# Patient Record
Sex: Male | Born: 1980 | Race: White | Hispanic: No | Marital: Married | Smoking: Never smoker
Health system: Southern US, Community
[De-identification: ages and names within clinical notes are randomized; demographics above are authoritative.]

## PROBLEM LIST (undated history)

## (undated) DIAGNOSIS — F419 Anxiety disorder, unspecified: Secondary | ICD-10-CM

## (undated) DIAGNOSIS — D367 Benign neoplasm of other specified sites: Secondary | ICD-10-CM

## (undated) DIAGNOSIS — F32A Depression, unspecified: Secondary | ICD-10-CM

## (undated) DIAGNOSIS — F329 Major depressive disorder, single episode, unspecified: Secondary | ICD-10-CM

## (undated) DIAGNOSIS — E669 Obesity, unspecified: Secondary | ICD-10-CM

## (undated) HISTORY — DX: Benign neoplasm of other specified sites: D36.7

## (undated) HISTORY — DX: Depression, unspecified: F32.A

## (undated) HISTORY — PX: KNEE ARTHROSCOPY: SUR90

## (undated) HISTORY — DX: Major depressive disorder, single episode, unspecified: F32.9

## (undated) HISTORY — DX: Obesity, unspecified: E66.9

## (undated) HISTORY — DX: Anxiety disorder, unspecified: F41.9

## (undated) HISTORY — PX: THORACOTOMY: SUR1349

---

## 2016-07-08 DIAGNOSIS — J0140 Acute pansinusitis, unspecified: Secondary | ICD-10-CM | POA: Diagnosis not present

## 2016-07-08 DIAGNOSIS — H66003 Acute suppurative otitis media without spontaneous rupture of ear drum, bilateral: Secondary | ICD-10-CM | POA: Diagnosis not present

## 2016-11-29 ENCOUNTER — Encounter: Payer: Self-pay | Admitting: Physician Assistant

## 2016-11-29 ENCOUNTER — Ambulatory Visit (INDEPENDENT_AMBULATORY_CARE_PROVIDER_SITE_OTHER): Payer: BLUE CROSS/BLUE SHIELD | Admitting: Physician Assistant

## 2016-11-29 VITALS — BP 117/75 | HR 64 | Temp 98.2°F | Ht 72.0 in | Wt 237.0 lb

## 2016-11-29 DIAGNOSIS — R52 Pain, unspecified: Secondary | ICD-10-CM

## 2016-11-29 DIAGNOSIS — Z7689 Persons encountering health services in other specified circumstances: Secondary | ICD-10-CM | POA: Diagnosis not present

## 2016-11-29 DIAGNOSIS — G5620 Lesion of ulnar nerve, unspecified upper limb: Secondary | ICD-10-CM | POA: Diagnosis not present

## 2016-11-29 MED ORDER — MELOXICAM 15 MG PO TABS: 15.0000 mg | ORAL_TABLET | Freq: Every day | ORAL | 1 refills | Status: DC

## 2016-11-29 NOTE — Progress Notes (Signed)
HPI:                                                                Troy Wilkerson is a 36 y.o. male who presents to [facility]: [place] today to establish care   Current Concerns include myalgias  Patient reports bilateral upper extremity arm pain x 1 week. Has noticed it mostly in his hands and arms. Denies paresthesias or weakness. Denies constitutional symptoms and rash. Denies recent tick bite.   Health Maintenance Health Maintenance  Topic Date Due  . HIV Screening  08/14/1995  . TETANUS/TDAP  08/14/1999  . INFLUENZA VACCINE  11/21/2016    Past Medical History:  Diagnosis Date  . 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 KNEE ARTHROSCOPY     left 2011  . THORACOTOMY     72 mos old   Social History  Substance Use Topics  . Smoking status: Never Smoker  . Smokeless tobacco: Never Used  . Alcohol use No   family history includes Cancer in his mother; Diabetes in his mother; Heart disease in his mother; Hypertension in his brother; Stroke in his brother.  ROS: negative except as noted in the HPI  Medications: Current Outpatient Prescriptions  Medication Sig Dispense Refill  . meloxicam (MOBIC) 15 MG tablet Take 1 tablet (15 mg total) by mouth daily. 30 tablet 1   No current facility-administered medications for this visit.    No Known Allergies     Objective:  BP 117/75 (BP Location: Left Arm, Patient Position: Sitting, Cuff Size: Normal)   Pulse 64   Temp 98.2 F (36.8 C) (Oral)   Ht 6' (1.829 m)   Wt 237 lb (107.5 kg)   SpO2 95%   BMI 32.14 kg/m  Gen:  Obese male, not ill-appearing, no distress HEENT: normocephalic without obvious deformity, normal conjunctiva, trachea midline Pulm: Normal work of breathing, normal phonation, clear to auscultation bilaterally CV: Normal rate, regular rhythm, s1 and s2 distinct, no murmurs, clicks or rubs, no carotid  bruit Neuro: alert and oriented x 3, sensation grossly intact, normal tone, no tremor MSK: upper extremities atraumatic bilaterally, wrist, elbow and shoulder strength 5/5 and symmetric, positive tinel's sign at the cubital fossa bilaterally, normal gait and station, no peripheral edema Skin: warm, dry, intact, no rashes on exposed skin Psych:  cooperative,normal affect, euthymic mood, normal speech and thought content   No results found for this or any previous visit (from the past 72 hour(s)). No results found.  Depression screen PHQ 2/9 11/29/2016  Decreased Interest 2  Down, Depressed, Hopeless 1  PHQ - 2 Score 3  Altered sleeping 3  Tired, decreased energy 2  Change in appetite 2  Feeling bad or failure about yourself  1  Trouble concentrating 1  Moving slowly or fidgety/restless 0  Suicidal thoughts 0  PHQ-9 Score 12     Assessment and Plan: 36 y.o. male with   1. Encounter to establish care - reviewed PMH - negative PHQ2  2. Ulnar neuropathy at elbow, unspecified laterality - Meloxicam 15mg  PO daily - Elbow sleeves nightly at bedtime - Follow-up with Sports Medicine in 4 weeks  3. Myalgia - CBC with Differential/Platelet - CK - Sedimentation rate - Comprehensive metabolic panel - Rheumatoid factor  Patient education and anticipatory guidance given Patient agrees with treatment plan Follow-up in 4 weeks with Sports Medicine or sooner as needed  Darlyne Russian PA-C

## 2016-11-29 NOTE — Patient Instructions (Addendum)
- Meloxicam 1 tab daily. No other OTC pain relievers except Tylenol - Elbow sleeves nightly at bedtime - seam should be in the center of your elbow where it flexes - Follow-up with Sports Medicine in 4 weeks  Cubital Tunnel Syndrome Cubital tunnel syndrome is a condition that causes pain and weakness of the forearm and hand. This condition happens when one of the nerves (ulnar nerve) that runs alongside the elbow joint becomes irritated. What are the causes? Causes of this condition include:  Increased pressure on the ulnar nerve at the elbow, arm, or forearm. This can be caused by: ? Swollen tissues. ? Ligaments. ? Muscles. ? Poorly healed elbow fractures. ? Tumors in the elbow. These are usually noncancerous (benign). ? Scar tissue that develops in the elbow after an injury. ? Bony growths (spurs) near the ulnar nerve.  Stretching of the nerve due to loose elbow ligaments.  Trauma to the nerve at the elbow.  Repetitive elbow bending.  Certain medical conditions.  What increases the risk? This condition is more likely to develop in:  People who do manual labor that requires frequently bending the elbow.  People who play sports that include repeated or strenuous throwing motions, such as baseball.  People who play contact sports, such as football or lacrosse.  People who do not warm up properly before activities.  People who have diabetes.  People who have an underactive thyroid (hypothyroidism).  What are the signs or symptoms? Symptoms of this condition include:  Clumsiness or weakness of the hand.  Tenderness of the inner elbow.  Aching or soreness of the inner elbow, forearm, or fingers, especially the little finger or the ring finger.  Increased pain with forced elbow bending.  Reduced control when throwing.  Tingling, numbness, or burning inside the forearm, or in part of the hand or fingers, especially the little finger or the ring finger.  Sharp pains  that shoot from the elbow down to the wrist and hand.  The inability to grip or pinch hard.  How is this diagnosed? This condition is diagnosed with a medical history and physical exam. Your health care provider will ask about your symptoms and ask for details about any injury. You may also have other tests, including:  Electromyogram (EMG). This test checks how well the nerve is working.  X-ray.  How is this treated? Treatment starts by stopping the activities that are causing your symptoms to get worse. Treatment may include the use of icing and medicines to reduce pain and swelling. You may also be advised to wear a splint to prevent your elbow from bending or wear an elbow pad where the ulnar nerve is closest to the skin. In less severe cases, treatment may also include working with a physical therapist:  To help decrease your symptoms.  To improve the strength and range of motion of your elbow, forearm, and hand.  If the treatments described above do not help, surgery may be needed. Follow these instructions at home: If you have a splint:  Wear it as told by your health care provider. Remove it only as told by your health care provider.  Loosen the splint if your fingers become numb and tingle, or if they turn cold and blue.  Keep the splint clean and dry. Managing pain, stiffness, and swelling  If directed, apply ice to the injured area: ? Put ice in a plastic bag. ? Place a towel between your skin and the bag. ? Leave the ice  on for 20 minutes, 2-3 times per day.  Move your fingers often to avoid stiffness and to lessen swelling.  Raise (elevate) the injured area above the level of your heart while you are sitting or lying down. General instructions  Take over-the-counter and prescription medicines only as told by your health care provider.  Keep all follow-up visits as told by your health care provider. This is important.  Do any exercise or physical therapy as told  by your health care provider.  Do not drive or operate heavy machinery while taking prescription pain medicine.  If you were given an elbow pad, wear it as told by your health care provider. Contact a health care provider if:  Your symptoms get worse.  Your symptoms do not get better with treatment.  Your have new pain.  Your hand on the injured side feels numb or cold. This information is not intended to replace advice given to you by your health care provider. Make sure you discuss any questions you have with your health care provider. Document Released: 04/09/2005 Document Revised: 09/15/2015 Document Reviewed: 06/16/2014 Elsevier Interactive Patient Education  [employer].

## 2016-12-06 ENCOUNTER — Encounter: Payer: Self-pay | Admitting: Physician Assistant

## 2016-12-06 DIAGNOSIS — G5620 Lesion of ulnar nerve, unspecified upper limb: Secondary | ICD-10-CM | POA: Insufficient documentation

## 2016-12-27 ENCOUNTER — Encounter: Payer: Self-pay | Admitting: Family Medicine

## 2016-12-27 ENCOUNTER — Ambulatory Visit (INDEPENDENT_AMBULATORY_CARE_PROVIDER_SITE_OTHER): Payer: BLUE CROSS/BLUE SHIELD | Admitting: Family Medicine

## 2016-12-27 ENCOUNTER — Ambulatory Visit (INDEPENDENT_AMBULATORY_CARE_PROVIDER_SITE_OTHER): Payer: BLUE CROSS/BLUE SHIELD

## 2016-12-27 DIAGNOSIS — M7711 Lateral epicondylitis, right elbow: Secondary | ICD-10-CM | POA: Diagnosis not present

## 2016-12-27 DIAGNOSIS — M25561 Pain in right knee: Secondary | ICD-10-CM | POA: Diagnosis not present

## 2016-12-27 DIAGNOSIS — M7710 Lateral epicondylitis, unspecified elbow: Secondary | ICD-10-CM | POA: Insufficient documentation

## 2016-12-27 DIAGNOSIS — F321 Major depressive disorder, single episode, moderate: Secondary | ICD-10-CM

## 2016-12-27 DIAGNOSIS — G8929 Other chronic pain: Secondary | ICD-10-CM

## 2016-12-27 DIAGNOSIS — M25562 Pain in left knee: Secondary | ICD-10-CM | POA: Insufficient documentation

## 2016-12-27 DIAGNOSIS — M7712 Lateral epicondylitis, left elbow: Secondary | ICD-10-CM | POA: Diagnosis not present

## 2016-12-27 DIAGNOSIS — M2550 Pain in unspecified joint: Secondary | ICD-10-CM | POA: Diagnosis not present

## 2016-12-27 DIAGNOSIS — M7989 Other specified soft tissue disorders: Secondary | ICD-10-CM | POA: Diagnosis not present

## 2016-12-27 LAB — TSH: TSH: 2.16 m[IU]/L (ref 0.40–4.50)

## 2016-12-27 MED ORDER — DICLOFENAC SODIUM 1 % TD GEL: 4.0000 g | Freq: Four times a day (QID) | TRANSDERMAL | 11 refills | Status: DC

## 2016-12-27 MED ORDER — DULOXETINE HCL 30 MG PO CPEP: 30.0000 mg | ORAL_CAPSULE | Freq: Every day | ORAL | 1 refills | Status: DC

## 2016-12-27 NOTE — Patient Instructions (Signed)
Thank you for coming in today. Use the gel on the knees and wrists.  Do the exercises and stretching frequently.  Start Cymbalta for both depression symptoms and pain  You should hear from therapy soon.  Recheck with me in 1 month and make a follow up appointment with Evlyn Clines in 2 weeks.  Get labs today.  Knee xray today as well.    Tennis Elbow Tennis elbow (lateral epicondylitis) is inflammation of the outer tendons of your forearm close to your elbow. Your tendons attach your muscles to your bones. The outer tendons of your forearm are used to extend your wrist, and they attach on the outside part of your elbow. Tennis elbow is often found in people who play tennis, but anyone may get the condition from repeatedly extending the wrist or turning the forearm. What are the causes? This condition is caused by repeatedly extending your wrist and using your hands. It can result from sports or work that requires repetitive forearm movements. Tennis elbow may also be caused by an injury. What increases the risk? You have a higher risk of developing tennis elbow if you play tennis or another racquet sport. You also have a higher risk if you frequently use your hands for work. This condition is also more likely to develop in:  Musicians.  Carpenters, painters, and plumbers.  Cooks.  Cashiers.  People who work in [employer].  Architect workers.  Butchers.  People who use computers.  What are the signs or symptoms? Symptoms of this condition include:  Pain and tenderness in your forearm and the outer part of your elbow. You may only feel the pain when you use your arm, or you may feel it even when you are not using your arm.  A burning feeling that runs from your elbow through your arm.  Weak grip in your hands.  How is this diagnosed? This condition may be diagnosed by medical history and physical exam. You may also have other tests, including:  X-rays.  MRI.  How is this  treated? Your health care provider will recommend lifestyle adjustments, such as resting and icing your arm. Treatment may also include:  Medicines for inflammation. This may include shots of cortisone if your pain continues.  Physical therapy. This may include massage or exercises.  An elbow brace.  Surgery may eventually be recommended if your pain does not go away with treatment. Follow these instructions at home: Activity  Rest your elbow and wrist as directed by your health care provider. Try to avoid any activities that caused the problem until your health care provider says that you can do them again.  If a physical therapist teaches you exercises, do all of them as directed.  If you lift an object, lift it with your palm facing upward. This lowers the stress on your elbow. Lifestyle  If your tennis elbow is caused by sports, check your equipment and make sure that: ? You are using it correctly. ? It is the best fit for you.  If your tennis elbow is caused by work, take breaks frequently, if you are able. Talk with your manager about how to best perform tasks in a way that is safe. ? If your tennis elbow is caused by computer use, talk with your manager about any changes that can be made to your work environment. General instructions  If directed, apply ice to the painful area: ? Put ice in a plastic bag. ? Place a towel between your skin  and the bag. ? Leave the ice on for 20 minutes, 2-3 times per day.  Take medicines only as directed by your health care provider.  If you were given a brace, wear it as directed by your health care provider.  Keep all follow-up visits as directed by your health care provider. This is important. Contact a health care provider if:  Your pain does not get better with treatment.  Your pain gets worse.  You have numbness or weakness in your forearm, hand, or fingers. This information is not intended to replace advice given to you by  your health care provider. Make sure you discuss any questions you have with your health care provider. Document Released: 04/09/2005 Document Revised: 12/08/2015 Document Reviewed: 04/05/2014 Elsevier Interactive Patient Education  2018 [employer].  Duloxetine delayed-release capsules What is this medicine? DULOXETINE (doo LOX e teen) is used to treat depression, anxiety, and different types of chronic pain. This medicine may be used for other purposes; ask your health care provider or pharmacist if you have questions. COMMON BRAND NAME(S): Cymbalta, [place] What should I tell my health care provider before I take this medicine? They need to know if you have any of these conditions: -bipolar disorder or a family history of bipolar disorder -glaucoma -kidney disease -liver disease -suicidal thoughts or a previous suicide attempt -taken medicines called MAOIs like Carbex, Eldepryl, Marplan, Nardil, and Parnate within 14 days -an unusual reaction to duloxetine, other medicines, foods, dyes, or preservatives -pregnant or trying to get pregnant -breast-feeding How should I use this medicine? Take this medicine by mouth with a glass of water. Follow the directions on the prescription label. Do not cut, crush or chew this medicine. You can take this medicine with or without food. Take your medicine at regular intervals. Do not take your medicine more often than directed. Do not stop taking this medicine suddenly except upon the advice of your doctor. Stopping this medicine too quickly may cause serious side effects or your condition may worsen. A special MedGuide will be given to you by the pharmacist with each prescription and refill. Be sure to read this information carefully each time. Talk to your pediatrician regarding the use of this medicine in children. While this drug may be prescribed for children as young as 76 years of age for selected conditions, precautions do apply. Overdosage: If  you think you have taken too much of this medicine contact a poison control center or emergency room at once. NOTE: This medicine is only for you. Do not share this medicine with others. What if I miss a dose? If you miss a dose, take it as soon as you can. If it is almost time for your next dose, take only that dose. Do not take double or extra doses. What may interact with this medicine? Do not take this medicine with any of the following medications: -desvenlafaxine -levomilnacipran -linezolid -MAOIs like Carbex, Eldepryl, Marplan, Nardil, and Parnate -methylene blue (injected into a vein) -milnacipran -thioridazine -venlafaxine This medicine may also interact with the following medications: -alcohol -amphetamines -aspirin and aspirin-like medicines -certain antibiotics like ciprofloxacin and enoxacin -certain medicines for blood pressure, heart disease, irregular heart beat -certain medicines for depression, anxiety, or psychotic disturbances -certain medicines for migraine headache like almotriptan, eletriptan, frovatriptan, naratriptan, rizatriptan, sumatriptan, zolmitriptan -certain medicines that treat or prevent blood clots like warfarin, enoxaparin, and dalteparin -cimetidine -fentanyl -lithium -NSAIDS, medicines for pain and inflammation, like ibuprofen or naproxen -phentermine -procarbazine -rasagiline -sibutramine -[facility]. John's  wort -theophylline -tramadol -tryptophan This list may not describe all possible interactions. Give your health care provider a list of all the medicines, herbs, non-prescription drugs, or dietary supplements you use. Also tell them if you smoke, drink alcohol, or use illegal drugs. Some items may interact with your medicine. What should I watch for while using this medicine? Tell your doctor if your symptoms do not get better or if they get worse. Visit your doctor or health care professional for regular checks on your progress. Because it may  take several weeks to see the full effects of this medicine, it is important to continue your treatment as prescribed by your doctor. Patients and their families should watch out for new or worsening thoughts of suicide or depression. Also watch out for sudden changes in feelings such as feeling anxious, agitated, panicky, irritable, hostile, aggressive, impulsive, severely restless, overly excited and hyperactive, or not being able to sleep. If this happens, especially at the beginning of treatment or after a change in dose, call your health care professional. Dennis Bast may get drowsy or dizzy. Do not drive, use machinery, or do anything that needs mental alertness until you know how this medicine affects you. Do not stand or sit up quickly, especially if you are an older patient. This reduces the risk of dizzy or fainting spells. Alcohol may interfere with the effect of this medicine. Avoid alcoholic drinks. This medicine can cause an increase in blood pressure. This medicine can also cause a sudden drop in your blood pressure, which may make you feel faint and increase the chance of a fall. These effects are most common when you first start the medicine or when the dose is increased, or during use of other medicines that can cause a sudden drop in blood pressure. Check with your doctor for instructions on monitoring your blood pressure while taking this medicine. Your mouth may get dry. Chewing sugarless gum or sucking hard candy, and drinking plenty of water may help. Contact your doctor if the problem does not go away or is severe. What side effects may I notice from receiving this medicine? Side effects that you should report to your doctor or health care professional as soon as possible: -allergic reactions like skin rash, itching or hives, swelling of the face, lips, or tongue -anxious -breathing problems -confusion -changes in vision -chest pain -confusion -elevated mood, decreased need for sleep,  racing thoughts, impulsive behavior -eye pain -fast, irregular heartbeat -feeling faint or lightheaded, falls -feeling agitated, angry, or irritable -hallucination, loss of contact with reality -high blood pressure -loss of balance or coordination -palpitations -redness, blistering, peeling or loosening of the skin, including inside the mouth -restlessness, pacing, inability to keep still -seizures -stiff muscles -suicidal thoughts or other mood changes -trouble passing urine or change in the amount of urine -trouble sleeping -unusual bleeding or bruising -unusually weak or tired -vomiting -yellowing of the eyes or skin Side effects that usually do not require medical attention (report to your doctor or health care professional if they continue or are bothersome): -change in sex drive or performance -change in appetite or weight -constipation -dizziness -dry mouth -headache -increased sweating -nausea -tired This list may not describe all possible side effects. Call your doctor for medical advice about side effects. You may report side effects to FDA at 1-800-FDA-1088. Where should I keep my medicine? Keep out of the reach of children. Store at room temperature between 20 and 25 degrees C (68 to 77 degrees F). Throw  away any unused medicine after the expiration date. NOTE: This sheet is a summary. It may not cover all possible information. If you have questions about this medicine, talk to your doctor, pharmacist, or health care provider.  2018 Elsevier/Gold Standard (2015-09-08 18:16:03)

## 2016-12-27 NOTE — Progress Notes (Signed)
Subjective:    I'm seeing this patient as a consultation for: Nelson Chimes, PA  CC: bilateral wrist, elbow, knee, and ankle pains  HPI: Patient reports bilateral wrist, elbow, knee, and ankle pains for 1 month.   Wrist and elbow pain: Patient rates the severity as about a 4/10. He does not recall any inciting events. They are not worse with any specific movements. He denies any numbness, tingling, or weakness. Patient reports that meloxicam helps alleviate the pains somewhat, but he does not take the medication every day. Patient endorses myalgias.   Knee and ankle pain: Patient rates knee pain as 5/10 in severity. He does not recall any inciting events. 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 Patient reports that pain is random and does not concentrate at any particular time of day. He denies any numbness or tingling. Patient reports a history of meniscal tear in his right knee. He notes the current pain is not consistent with previous episodes of meniscus injury. Patient denies any personal or family history of autoimmune diseases.   Mood: Patient reports difficulty getting out of bed for work in the mornings. He has lost interest in his new job. Patient reports having difficulty with sleep.   Patient denies any fevers, chills, weight loss, diarrhea, constipation, changes in urination, or shortness of breath.   Past medical history, Surgical history, Family history not pertinant except as noted below, Social history, Allergies, and medications have been entered into the medical record, reviewed, and no changes needed.   Review of Systems: No headache, visual changes, nausea, vomiting, diarrhea, constipation, dizziness, abdominal pain, skin rash, fevers, chills, night sweats, weight loss, swollen lymph nodes, body aches, joint swelling, muscle aches, chest pain, shortness of breath, mood changes, visual or auditory hallucinations.     Objective:    Vitals:   12/27/16 0843  BP: 117/78  Pulse: 60   General: Well Developed, well nourished, and in no acute distress.  Neuro/Psych: Alert and oriented x3, extra-ocular muscles intact, able to move all 4 extremities, sensation grossly intact. Depressed mood today.  Skin: Warm and dry, no rashes noted.  Respiratory: Not using accessory muscles, speaking in full sentences, trachea midline.  Cardiovascular: Pulses palpable, no extremity edema. Abdomen: Does not appear distended. MSK: Right and left elbow: No gross deformity or edema on inspection Tenderness to palpation at lateral and medial epicondyles  Range of motion is full Strength is 5/5 with flexion and extension Pain is somewhat reproducible with resisted extension and flexion of the wrist  Right and left wrist: No gross deformity or edema or inspection Tenderness to palpation at dorsal wrist overlying the fourth dorsal wrist compartment Range of motion is full, wrist extension reproduces pain and tightness at elbows and lateral forearms Strength is 5/5 with flexion and extension  Knees: No gross deformity, edema, or bruising on inspection Tenderness to palpation at medial joint line Range of motion is full with flexion and extension, no pain with varus or valgus force  Stable ligamentous exam negative McMurray's testing. 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 Awaiting formal radiology review.  No results found for this or any previous visit (from the past 24 hour(s)). No results found.   Depression screen [facility] 2/9 12/27/2016 11/29/2016  Decreased Interest 3 2  Down, Depressed, Hopeless 2 1  PHQ - 2 Score 5 3  Altered sleeping 3 3  Tired, decreased energy 3 2  Change in appetite 0 2  Feeling bad or failure about yourself  1 1  Trouble concentrating 2 1  Moving slowly or fidgety/restless 2 0  Suicidal thoughts 0 0  PHQ-9 Score 16 12   Difficult doing work/chores Somewhat difficult -     Impression and Recommendations:    Assessment and Plan: 36 y.o. male with bilateral wrist, elbow, knee, and ankle pains. 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 Hand exercises were reviewed with patient. At this time, he does not need dedicated hand PT, but this will be the next step if pain does not improve. He was also given diclofenac gel to apply as needed.  For knee and ankle pains, patient will undergo x-rays today. Final read of x-rays are pending at this time. Given patient's concurrent bilateral joint pains in upper extremity, he should also have a rheumatologic workup. Patient was advised to also apply diclofenac gel as needed to knees and continue taking meloxicam.   Patient reports depressed mood and had an increase in PHQ-9 since 1 month ago. An appointment with [place] LCSW for therapy will be beneficial and is indicated at this time. Patient was also started on duloxetine. This should help with mood as well as his joint pains. Follow-up with PCP in 2 weeks and me in 4 weeks.   Patient was advised to start this medication and follow-up in clinic if there is any acute worsening of his condition.   Orders Placed This Encounter  Procedures  . DG Knee Complete 4 Views Left    Please include patellar sunrise, lateral, and weightbearing bilateral AP and bilateral rosenberg views    Standing Status:   Future    Number of Occurrences:   1    Standing Expiration Date:   02/26/2018    Order Specific Question:   Reason for exam:    Answer:   Please include patellar sunrise, lateral, and weightbearing bilateral AP and bilateral rosenberg views    Comments:   Please include patellar sunrise, lateral, and weightbearing bilateral AP and bilateral rosenberg views    Order Specific Question:   Preferred  imaging location?    Answer:   [facility]  . DG Knee Complete 4 Views Right    Please include patellar sunrise, lateral, and weightbearing bilateral AP and bilateral rosenberg views    Standing Status:   Future    Number of Occurrences:   1    Standing Expiration Date:   02/26/2018    Order Specific Question:   Reason for exam:    Answer:   Please include patellar sunrise, lateral, and weightbearing bilateral AP and bilateral rosenberg views    Comments:   Please include patellar sunrise, lateral, and weightbearing bilateral AP and bilateral rosenberg views    Order Specific Question:   Preferred imaging location?    Answer:   [facility]  . [facility]  . Ambulatory referral to Behavioral Health    Referral Priority:   Routine    Referral Type:   Psychiatric    Referral Reason:   Specialty Services Required    Requested Specialty:   Behavioral Health    Number of Visits Requested:   1   Meds ordered this encounter  Medications  . diclofenac sodium (VOLTAREN) 1 % GEL    Sig: Apply 4 g topically 4 (four)  times daily. To affected joint.    Dispense:  100 g    Refill:  11  . DULoxetine (CYMBALTA) 30 MG capsule    Sig: Take 1 capsule (30 mg total) by mouth daily.    Dispense:  30 capsule    Refill:  1    Discussed warning signs or symptoms. Please see discharge instructions. Patient expresses understanding.

## 2016-12-27 NOTE — Progress Notes (Unsigned)
Left a message about x-ray results. Will try calling back again.

## 2016-12-28 LAB — CBC WITH DIFFERENTIAL/PLATELET
BASOS ABS: 60 {cells}/uL (ref 0–200)
Basophils Relative: 0.7 %
EOS PCT: 1.9 %
Eosinophils Absolute: 163 cells/uL (ref 15–500)
HCT: 43.8 % (ref 38.5–50.0)
HEMOGLOBIN: 14.5 g/dL (ref 13.2–17.1)
Lymphs Abs: 2614 cells/uL (ref 850–3900)
MCH: 28.8 pg (ref 27.0–33.0)
MCHC: 33.1 g/dL (ref 32.0–36.0)
MCV: 86.9 fL (ref 80.0–100.0)
MONOS PCT: 8.5 %
MPV: 11.4 fL (ref 7.5–12.5)
NEUTROS ABS: 5031 {cells}/uL (ref 1500–7800)
Neutrophils Relative %: 58.5 %
Platelets: 266 10*3/uL (ref 140–400)
RBC: 5.04 10*6/uL (ref 4.20–5.80)
RDW: 12.9 % (ref 11.0–15.0)
Total Lymphocyte: 30.4 %
WBC mixed population: 731 cells/uL (ref 200–950)
WBC: 8.6 10*3/uL (ref 3.8–10.8)

## 2016-12-28 LAB — COMPLETE METABOLIC PANEL WITH GFR
AG RATIO: 1.7 (calc) (ref 1.0–2.5)
ALKALINE PHOSPHATASE (APISO): 63 U/L (ref 40–115)
ALT: 26 U/L (ref 9–46)
AST: 16 U/L (ref 10–40)
Albumin: 4.4 g/dL (ref 3.6–5.1)
BUN: 12 mg/dL (ref 7–25)
CALCIUM: 9.6 mg/dL (ref 8.6–10.3)
CO2: 29 mmol/L (ref 20–32)
CREATININE: 0.95 mg/dL (ref 0.60–1.35)
Chloride: 103 mmol/L (ref 98–110)
GFR, Est African American: 119 mL/min/{1.73_m2} (ref >=60)
GFR, Est Non African American: 103 mL/min/{1.73_m2} (ref >=60)
GLOBULIN: 2.6 g/dL (ref 1.9–3.7)
Glucose, Bld: 86 mg/dL (ref 65–99)
POTASSIUM: 4 mmol/L (ref 3.5–5.3)
SODIUM: 140 mmol/L (ref 135–146)
Total Bilirubin: 0.7 mg/dL (ref 0.2–1.2)
Total Protein: 7 g/dL (ref 6.1–8.1)

## 2016-12-28 LAB — CK: CK TOTAL: 133 U/L (ref 44–196)

## 2016-12-28 LAB — SEDIMENTATION RATE: Sed Rate: 2 mm/h (ref 0–15)

## 2016-12-28 LAB — RHEUMATOID FACTOR

## 2016-12-28 NOTE — Progress Notes (Signed)
Normal labs No evidence of inflammatory disorder

## 2017-01-10 ENCOUNTER — Encounter: Payer: Self-pay | Admitting: Physician Assistant

## 2017-01-10 ENCOUNTER — Ambulatory Visit (INDEPENDENT_AMBULATORY_CARE_PROVIDER_SITE_OTHER): Payer: BLUE CROSS/BLUE SHIELD | Admitting: Physician Assistant

## 2017-01-10 VITALS — BP 119/80 | HR 54 | Wt 239.0 lb

## 2017-01-10 DIAGNOSIS — F411 Generalized anxiety disorder: Secondary | ICD-10-CM | POA: Diagnosis not present

## 2017-01-10 DIAGNOSIS — M899 Disorder of bone, unspecified: Secondary | ICD-10-CM | POA: Diagnosis not present

## 2017-01-10 DIAGNOSIS — R6882 Decreased libido: Secondary | ICD-10-CM

## 2017-01-10 DIAGNOSIS — F321 Major depressive disorder, single episode, moderate: Secondary | ICD-10-CM

## 2017-01-10 MED ORDER — ESCITALOPRAM OXALATE 5 MG PO TABS: 10.0000 mg | ORAL_TABLET | Freq: Every day | ORAL | 0 refills | Status: DC

## 2017-01-10 NOTE — Progress Notes (Signed)
HPI:                                                                Troy Wilkerson is a 36 y.o. male who presents to [facility]: Primary [facility] Medicine today for depression follow-up  Depression/Anxiety: patient screened positive for depression at his Sports Medicine visit on 12/27/16. He was started on Cymbalta. States he took the medication for 1 week, but self-discontinued due to GI upset. Denies history of mood disorder or anxiety. Reports he is under a lot of stress at his new job. Denies symptoms of mania/hypomania. Denies suicidal thinking. Denies auditory/visual hallucinations.  Troy Wilkerson also endorses low libido, falling asleep after dinner in addition to mood changes.  Past Medical History:  Diagnosis Date  . 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 Obesity    Past Surgical History:  Procedure Laterality Date  . KNEE ARTHROSCOPY     left 2011  . THORACOTOMY     36 mos old   Social History  Substance Use Topics  . Smoking status: Never Smoker  . Smokeless tobacco: Never Used  . Alcohol use No   family history includes Cancer in his mother; Diabetes in his mother; Heart disease in his mother; Hypertension in his brother; Stroke in his brother.  ROS: negative except as noted in the HPI  Medications: Current Outpatient Prescriptions  Medication Sig Dispense Refill  . diclofenac sodium (VOLTAREN) 1 % GEL Apply 4 g topically 4 (four) times daily. To affected joint. 100 g 11  . meloxicam (MOBIC) 15 MG tablet Take 1 tablet (15 mg total) by mouth daily. 30 tablet 1  . escitalopram (LEXAPRO) 5 MG tablet Take 2 tablets (10 mg total) by mouth daily. 60 tablet 0   No current facility-administered medications for this visit.    No Known Allergies     Objective:  BP 119/80   Pulse (!) 54   Wt 239 lb (108.4 kg)   BMI 32.41 kg/m  Gen:  alert, not ill-appearing, no distress, appropriate for age, obese male HEENT: head normocephalic without obvious  abnormality, conjunctiva and cornea clear, wearing glasses, trachea midline Pulm: Normal work of breathing, normal phonation Neuro: alert and oriented x 3, no tremor MSK: extremities atraumatic, normal gait and station Skin: intact, no rashes on exposed skin, no jaundice, no cyanosis Psych: well-groomed, cooperative, good eye contact, depressed mood, affect mood-congruent, speech is articulate, and thought processes clear and goal-directed  No results found for this or any previous visit (from the past 72 hour(s)).   Depression screen [facility] 2/9 01/10/2017 12/27/2016 11/29/2016  Decreased Interest 2 3 2   Down, Depressed, Hopeless 1 2 1   PHQ - 2 Score 3 5 3   Altered sleeping 3 3 3   Tired, decreased energy 3 3 2   Change in appetite 2 0 2  Feeling bad or failure about yourself  1 1 1   Trouble concentrating 2 2 1   Moving slowly or fidgety/restless 2 2 0  Suicidal thoughts 0 0 0  PHQ-9 Score 16 16 12   Difficult doing work/chores - Somewhat difficult -   GAD 7 : Generalized Anxiety Score 01/10/2017  Nervous, Anxious, on Edge 3  Control/stop worrying 3  Worry too much -  different things 3  Trouble relaxing 3  Restless 2  Easily annoyed or irritable 1  Afraid - awful might happen 1  Total GAD 7 Score 16       Assessment and Plan: 36 y.o. male with   1. Depression, major, single episode, moderate, Anxiety - PHQ9 score 16, moderately severe; no acute safety issues. GAD7 score 16, severe. Starting low-dose lexapro with plan to titrate to lowest effective dose. Will also test for hypogonadism - encouraged sleep hygiene  - escitalopram (LEXAPRO) 5 MG tablet; Take 2 tablets (10 mg total) by mouth daily.  Dispense: 60 tablet; Refill: 0  2. Bone lesion - reviewed recent knee xray from 12/27/16 ordered by Dr. Georgina Snell. MR was recommended to fully characterize the bony lesion.  - MR FEMUR LEFT W WO CONTRAST; Future  3. Low libido - patient has positive screen (6/12 symptoms) for hypogonadism -  Testosterone at 8am  Patient education and anticipatory guidance given Patient agrees with treatment plan Follow-up as needed if symptoms worsen or fail to improve  Darlyne Russian PA-C

## 2017-01-10 NOTE — Patient Instructions (Addendum)
For mood/anxiety: - take 1 tablet every morning for 1 week. Then, increase to 2 tablets daily - OTC Benadryl 25mg  at bedtime. Increase to 50mg  if not better after a few nights   Sleep Hygiene . Limiting daytime naps to 30 minutes . Napping does not make up for inadequate nighttime sleep. However, a short nap of 20-30 minutes can help to improve mood, alertness and performance.  . Avoiding stimulants such as  caffeine and nicotine close to bedtime.  And when it comes to alcohol, moderation is key 4. While alcohol is well-known to help you fall asleep faster, too much close to bedtime can disrupt sleep in the second half of the night as the body begins to process the alcohol.    . Exercising to promote good quality sleep.  As little as 10 minutes of aerobic exercise, such as walking or cycling, can drastically improve nighttime sleep quality.  For the best night's sleep, most people should avoid strenuous workouts close to bedtime. However, the effect of intense nighttime exercise on sleep differs from person to person, so find out what works best for you.   . Steering clear of food that can be disruptive right before sleep.   Heavy or rich foods, fatty or fried meals, spicy dishes, citrus fruits, and carbonated drinks can trigger indigestion for some people. When this occurs close to bedtime, it can lead to painful heartburn that disrupts sleep. . Ensuring adequate exposure to natural light.  This is particularly important for individuals who may not venture outside frequently. Exposure to sunlight during the day, as well as darkness at night, helps to maintain a healthy sleep-wake cycle . Marland Kitchen Establishing a regular relaxing bedtime routine.  A regular nightly routine helps the body recognize that it is bedtime. This could include taking warm shower or bath, reading a book, or light stretches. When possible, try to avoid emotionally upsetting conversations and activities before attempting to sleep. . Making  sure that the sleep environment is pleasant.  Mattress and pillows should be comfortable. The bedroom should be cool - between 60 and 67 degrees - for optimal sleep. Bright light from lamps, cell phone and TV screens can make it difficult to fall asleep4, so turn those light off or adjust them when possible. Consider using blackout curtains, eye shades, ear plugs, "white noise" machines, humidifiers, fans and other devices that can make the bedroom more relaxing.

## 2017-01-14 ENCOUNTER — Ambulatory Visit (INDEPENDENT_AMBULATORY_CARE_PROVIDER_SITE_OTHER): Payer: BLUE CROSS/BLUE SHIELD

## 2017-01-14 DIAGNOSIS — M899 Disorder of bone, unspecified: Secondary | ICD-10-CM

## 2017-01-14 DIAGNOSIS — M79662 Pain in left lower leg: Secondary | ICD-10-CM | POA: Diagnosis not present

## 2017-01-14 MED ORDER — GADOBENATE DIMEGLUMINE 529 MG/ML IV SOLN
20.0000 mL | Freq: Once | INTRAVENOUS | Status: AC | PRN
  Administered 2017-01-14: 20 mL via INTRAVENOUS

## 2017-01-14 NOTE — Progress Notes (Signed)
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 Georgina Snell as needed for all sports related issues

## 2017-01-16 DIAGNOSIS — F418 Other specified anxiety disorders: Secondary | ICD-10-CM | POA: Insufficient documentation

## 2017-01-18 ENCOUNTER — Telehealth: Payer: Self-pay

## 2017-01-18 NOTE — Telephone Encounter (Signed)
Pt notified -EH/RMA  

## 2017-01-18 NOTE — Telephone Encounter (Signed)
Spoke with Dr. Georgina Snell and he does not believe this lesion is the cause of his knee pain. It does not require any intervention, surgical or otherwise. Recommend following up with Dr. Georgina Snell if he would like to discuss further

## 2017-01-18 NOTE — Telephone Encounter (Signed)
Please look at last imaging results note and advise. -EH/RMA

## 2017-01-22 ENCOUNTER — Ambulatory Visit (INDEPENDENT_AMBULATORY_CARE_PROVIDER_SITE_OTHER): Payer: BLUE CROSS/BLUE SHIELD | Admitting: Family Medicine

## 2017-01-22 VITALS — BP 130/80 | HR 75 | Wt 235.0 lb

## 2017-01-22 DIAGNOSIS — M25561 Pain in right knee: Secondary | ICD-10-CM

## 2017-01-22 DIAGNOSIS — M25562 Pain in left knee: Secondary | ICD-10-CM

## 2017-01-22 NOTE — Progress Notes (Signed)
   Subjective:      CC: Left knee pain  HPI:  Patient was previously seen for pain in elbows and knees on September 6. He continues to have pain in these areas and reports that the worst pain is in his left knee. He describes the pain as achy and 8/10 at its worst. He notes that the pain is worse at the end of the day and when climbing stairs. 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 He also reports some stiffness and tightness in the knee when waking up. Patient has tried using diclofenac gel, but has not found this to be helpful in relieving his pain.  Past medical history, Surgical history, Family history not pertinant except as noted below, Social history, Allergies, and medications have been entered into the medical record, reviewed, and no changes needed.   Review of Systems: No headache, visual changes, nausea, vomiting, diarrhea, constipation, dizziness, abdominal pain, skin rash, fevers, chills, night sweats, weight loss, swollen lymph nodes, body aches, joint swelling, muscle aches, chest pain, shortness of breath, mood changes, visual or auditory hallucinations.   Objective:    Vitals:   01/22/17 1610  BP: 130/80  Pulse: 75  SpO2: 98%   General: Well Developed, well nourished, and in no acute distress.  Neuro/Psych: Alert and oriented x3, extra-ocular muscles intact, able to move all 4 extremities, sensation grossly intact. Skin: Warm and dry, no rashes noted.  Respiratory: Not using accessory muscles, speaking in full sentences, trachea midline.  Cardiovascular: Pulses palpable, no extremity edema. Abdomen: Does not appear distended. MSK: Left knee: No gross deformity or swelling on inspection Mild tenderness to palpation at medial joint line  Range of motion if full with flexion and extension Strength is 5/5 with flexion and extension  X-ray knee and MRI femur reviewed   No results found for this or any previous visit (from the past 24  hour(s)). No results found.  Impression and Recommendations:    Assessment and Plan: 36 y.o. male presenting for follow-up of left knee pain. Patient has experienced some relief using conservative treatment methods. He is reluctant to pursue more aggressive interventions such as steroid injections as this time. Results of recent MRI were reviewed with the patient. 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 Patient will continue conservative treatment and follow-up in clinic if he decides to receive an injection or if pain worsens.    No orders of the defined types were placed in this encounter.  No orders of the defined types were placed in this encounter.   Discussed warning signs or symptoms. Please see discharge instructions. Patient expresses understanding.  I spent 15 minutes with this patient, greater than 50% was face-to-face time counseling regarding ddx and treatment plan.

## 2017-01-22 NOTE — Patient Instructions (Signed)
Thank you for coming in today. If worse or not better we will do a steroid injection to the knee.  We may also consider Orthovisc injections if not better with steroid injection.   Sodium Hyaluronate intra-articular injection What is this medicine? SODIUM HYALURONATE (SOE dee um hye al yoor ON ate) is used to treat pain in the knee due to osteoarthritis. This medicine may be used for other purposes; ask your health care provider or pharmacist if you have questions. COMMON BRAND NAME(S): Amvisc, DUROLANE, Euflexxa, GELSYN-3, Hyalgan, Monovisc, Orthovisc, Supartz, Supartz FX What should I tell my health care provider before I take this medicine? They need to know if you have any of these conditions: -bleeding disorders -glaucoma -infection in the knee joint -skin conditions or sensitivity -skin infection -an unusual allergic reaction to sodium hyaluronate, other medicines, foods, dyes, or preservatives. Different brands of sodium hyaluronate contain different allergens. Some may contain egg. Talk to your doctor about your allergies to make sure that you get the right product. -pregnant or trying to get pregnant -breast-feeding How should I use this medicine? This medicine is for injection into the knee joint. It is given by a health care professional in a hospital or clinic setting. Talk to your pediatrician regarding the use of this medicine in children. Special care may be needed. Overdosage: If you think you have taken too much of this medicine contact a poison control center or emergency room at once. NOTE: This medicine is only for you. Do not share this medicine with others. What if I miss a dose? This does not apply. What may interact with this medicine? Interactions are not expected. This list may not describe all possible interactions. Give your health care provider a list of all the medicines, herbs, non-prescription drugs, or dietary supplements you use. Also tell them if you  smoke, drink alcohol, or use illegal drugs. Some items may interact with your medicine. What should I watch for while using this medicine? Tell your doctor or healthcare professional if your symptoms do not start to get better or if they get worse. If receiving this medicine for osteoarthritis, limit your activity after you receive your injection. Avoid physical activity for 48 hours following your injection to keep your knee from swelling. Do not stand on your feet for more than 1 hour at a time during the first 48 hours following your injection. Ask your doctor or healthcare professional about when you can begin major physical activity again. What side effects may I notice from receiving this medicine? Side effects that you should report to your doctor or health care professional as soon as possible: -allergic reactions like skin rash, itching or hives, swelling of the face, lips, or tongue -dizziness -facial flushing -pain, tingling, numbness in the hands or feet -vision changes if received this medicine during eye surgery Side effects that usually do not require medical attention (report to your doctor or health care professional if they continue or are bothersome): -back pain -bruising at site where injected -chills -diarrhea -fever -headache -joint pain -joint stiffness -joint swelling -muscle cramps -muscle pain -nausea, vomiting -pain, redness, or irritation at site where injected -weak or tired This list may not describe all possible side effects. Call your doctor for medical advice about side effects. You may report side effects to FDA at 1-800-FDA-1088. Where should I keep my medicine? This drug is given in a hospital or clinic and will not be stored at home. NOTE: This sheet is a summary.  It may not cover all possible information. If you have questions about this medicine, talk to your doctor, pharmacist, or health care provider.  2018 Elsevier/Gold Standard (2015-05-12  08:34:51)

## 2017-01-23 ENCOUNTER — Telehealth: Payer: Self-pay

## 2017-01-23 ENCOUNTER — Other Ambulatory Visit: Payer: Self-pay | Admitting: Physician Assistant

## 2017-01-23 MED ORDER — ONDANSETRON HCL 4 MG PO TABS: 4.0000 mg | ORAL_TABLET | Freq: Three times a day (TID) | ORAL | 0 refills | Status: DC | PRN

## 2017-01-23 NOTE — Telephone Encounter (Signed)
Pt reports side effects with lexapro.  He stated that he can't keep any food down and he is only getting about 3 hours sleep each night.  He wanting to know could he be placed on something else. Please advise. -EH/RMA

## 2017-01-23 NOTE — Telephone Encounter (Signed)
Left vm for pt to return call to clinic -EH/RMA  

## 2017-01-23 NOTE — Telephone Encounter (Signed)
Advise him to take 1/2 tablet. These are common side effects of the medication and will resolve when patient acclimates to it. I will send in medication for nausea

## 2017-01-24 NOTE — Telephone Encounter (Signed)
Patient advised of recommendations. He states he took 2 capsules this morning. He states he has a feeling of someone wringing his stomach. When his mood or mind is not racing then his stomach feels better. He also wakes after 3 hours of sleep and his mind is racing and has nausea. Should he take 1 tomorrow or reduce to the 1/2 daily?

## 2017-01-24 NOTE — Telephone Encounter (Signed)
Same recommendation. 1/2 tablet

## 2017-01-24 NOTE — Telephone Encounter (Signed)
Patient advised of recommendations.  

## 2017-01-28 ENCOUNTER — Ambulatory Visit: Payer: BLUE CROSS/BLUE SHIELD | Admitting: Family Medicine

## 2017-01-28 DIAGNOSIS — Z0189 Encounter for other specified special examinations: Secondary | ICD-10-CM

## 2017-02-07 ENCOUNTER — Encounter: Payer: Self-pay | Admitting: Physician Assistant

## 2017-02-07 ENCOUNTER — Ambulatory Visit (INDEPENDENT_AMBULATORY_CARE_PROVIDER_SITE_OTHER): Payer: BLUE CROSS/BLUE SHIELD | Admitting: Physician Assistant

## 2017-02-07 VITALS — BP 125/85 | HR 70 | Wt 236.0 lb

## 2017-02-07 DIAGNOSIS — F321 Major depressive disorder, single episode, moderate: Secondary | ICD-10-CM | POA: Diagnosis not present

## 2017-02-07 DIAGNOSIS — F411 Generalized anxiety disorder: Secondary | ICD-10-CM

## 2017-02-07 DIAGNOSIS — R110 Nausea: Secondary | ICD-10-CM | POA: Diagnosis not present

## 2017-02-07 DIAGNOSIS — L03011 Cellulitis of right finger: Secondary | ICD-10-CM | POA: Diagnosis not present

## 2017-02-07 MED ORDER — CHLORHEXIDINE GLUCONATE 4 % EX LIQD: CUTANEOUS | 0 refills | Status: DC

## 2017-02-07 MED ORDER — CEPHALEXIN 500 MG PO CAPS: 500.0000 mg | ORAL_CAPSULE | Freq: Two times a day (BID) | ORAL | 0 refills | Status: DC

## 2017-02-07 MED ORDER — ONDANSETRON HCL 4 MG PO TABS: 4.0000 mg | ORAL_TABLET | Freq: Three times a day (TID) | ORAL | 0 refills | Status: DC | PRN

## 2017-02-07 MED ORDER — PROMETHAZINE HCL 25 MG PO TABS: 25.0000 mg | ORAL_TABLET | Freq: Four times a day (QID) | ORAL | 0 refills | Status: DC | PRN

## 2017-02-07 NOTE — Progress Notes (Signed)
HPI:                                                                Troy Wilkerson is a 36 y.o. male who presents to [place] today for   Depression/Anxiety: taking Lexapro 2.5 mg every morning. Initially there was concern about GI upset so patient's dose was reduced. He reports he wakes up at 4am with nausea and sometimes vomiting and diarrhea almost daily. This has been going on prior to starting Lexapro, however. States he feels anxious and worries about his business. 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 He cites financial stress as a main trigger. Prior to this, he had no history of anxiety or depression. He does endorse occasional suicidal thinking, however, states he has never formed a plan and would not act on these thoughts because he has a family. Denies symptoms of mania/hypomania. Denies auditory/visual hallucinations.   Patient also c/o right 4th finger pain beginning x 5 days. Stated he bumped it at work and noted tenderness and "yellowy" discoloration of the nail fold. Denies swelling, severe pain or drainage.  Past Medical History:  Diagnosis Date  . 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 Obesity    Past Surgical History:  Procedure Laterality Date  . KNEE ARTHROSCOPY     left 2011  . THORACOTOMY     20 mos old   Social History  Substance Use Topics  . Smoking status: Never Smoker  . Smokeless tobacco: Never Used  . Alcohol use No   family history includes Cancer in his mother; Diabetes in his mother; Heart disease in his mother; Hypertension in his brother; Stroke in his brother.  ROS: negative except as noted in the HPI  Medications: Current Outpatient Prescriptions  Medication Sig Dispense Refill  . diclofenac sodium (VOLTAREN) 1 % GEL Apply 4 g topically 4 (four) times daily. To affected joint. 100 g 11  . escitalopram (LEXAPRO) 5 MG tablet Take 2 tablets (10 mg  total) by mouth daily. 60 tablet 0  . meloxicam (MOBIC) 15 MG tablet Take 1 tablet (15 mg total) by mouth daily. 30 tablet 1  . ondansetron (ZOFRAN) 4 MG tablet Take 1 tablet (4 mg total) by mouth every 8 (eight) hours as needed for nausea or vomiting. 20 tablet 0  . cephALEXin (KEFLEX) 500 MG capsule Take 1 capsule (500 mg total) by mouth 2 (two) times daily. 14 capsule 0  . chlorhexidine (HIBICLENS) 4 % external liquid Soak affected area with warm water 2-4 times daily 236 mL 0  . promethazine (PHENERGAN) 25 MG tablet Take 1 tablet (25 mg total) by mouth every 6 (six) hours as needed for nausea or vomiting. 30 tablet 0   No current facility-administered medications for this visit.    Allergies  Allergen Reactions  . Cymbalta [Duloxetine Hcl] Nausea And Vomiting       Objective:  BP 125/85   Pulse 70   Wt 236 lb (107 kg)   BMI 32.01 kg/m  Gen:  alert, not ill-appearing, no distress, appropriate for age, obese male HEENT: head normocephalic without obvious abnormality, conjunctiva and cornea clear, wearing glasses, trachea midline  Pulm: Normal work of breathing,  normal phonation Neuro: alert and oriented x 3, no tremor MSK: extremities atraumatic, normal gait and station Skin: right distal 4th phalanx with mild erythema and swelling at the right lateral nail fold, tenderness, no drainage Psych: well-groomed, cooperative, good eye contact, tearful, depressed mood, affect mood-congruent, speech is articulate, and thought processes clear and goal-directed  Depression screen [facility] 2/9 02/07/2017 01/10/2017 12/27/2016 11/29/2016  Decreased Interest 3 2 3 2   Down, Depressed, Hopeless 2 1 2 1   PHQ - 2 Score 5 3 5 3   Altered sleeping 3 3 3 3   Tired, decreased energy 3 3 3 2   Change in appetite 2 2 0 2  Feeling bad or failure about yourself  2 1 1 1   Trouble concentrating 2 2 2 1   Moving slowly or fidgety/restless 2 2 2  0  Suicidal thoughts 1 0 0 0  PHQ-9 Score 20 16 16 12   Difficult  doing work/chores Very difficult - Somewhat difficult -   GAD 7 : Generalized Anxiety Score 02/07/2017 01/10/2017  Nervous, Anxious, on Edge 2 3  Control/stop worrying 3 3  Worry too much - different things 3 3  Trouble relaxing 3 3  Restless 2 2  Easily annoyed or irritable 1 1  Afraid - awful might happen 1 1  Total GAD 7 Score 15 16  Anxiety Difficulty Very difficult -      No results found for this or any previous visit (from the past 72 hour(s)). No results found.    Assessment and Plan: 36 y.o. male with   1. Depression, major, single episode, moderate (HCC) - PHQ9 score 20, severe. No acute safety issues. Patient contracted for safety - titrating Lexapro to 10mg . Close follow-up in 2 weeks  2. Anxiety state - GAD7 score 15, severe - Lexapro as above  3. Nausea - 2/2 anxiety attacks. Premedicate with Phenergan nightly at bedtime. Use zofran as needed during the day for breakthrough nausea - ondansetron (ZOFRAN) 4 MG tablet; Take 1 tablet (4 mg total) by mouth every 8 (eight) hours as needed for nausea or vomiting.  Dispense: 20 tablet; Refill: 0 - promethazine (PHENERGAN) 25 MG tablet; Take 1 tablet (25 mg total) by mouth every 6 (six) hours as needed for nausea or vomiting.  Dispense: 30 tablet; Refill: 0  4. Paronychia of finger of right hand - early stages, incision & drainage would not yield much at this point - Hibiclens soaks 2-4 times daily.  - Keflex - discussed signs and symptoms warranting return for I&D - cephALEXin (KEFLEX) 500 MG capsule; Take 1 capsule (500 mg total) by mouth 2 (two) times daily.  Dispense: 14 capsule; Refill: 0 - chlorhexidine (HIBICLENS) 4 % external liquid; Soak affected area with warm water 2-4 times daily  Dispense: 236 mL; Refill: 0   Patient education and anticipatory guidance given Patient agrees with treatment plan Follow-up in 2 weeks or sooner as needed if symptoms worsen or fail to improve  I spent 25 minutes with this  patient, greater than 50% was face-to-face time counseling regarding the above diagnoses  Darlyne Russian PA-C

## 2017-02-07 NOTE — Patient Instructions (Addendum)
-   Increase Lexapro to full tab daily for 3 days. Then increase to 2 tabs daily - Start Phenergan 1/2 tablet at bedtime. Increase to full tablet if needed - use Zofran for daytime episodes of nausea - Return in 2 weeks  Safety Plan: 1.  2.  3. Our Office [phone] 4. National [phone] 5. National Suicide Hotline 1-800-SUICIDE 6. If in immediate danger of harming yourself, go to the nearest emergency room or call 911

## 2017-02-28 ENCOUNTER — Ambulatory Visit (INDEPENDENT_AMBULATORY_CARE_PROVIDER_SITE_OTHER): Payer: BLUE CROSS/BLUE SHIELD | Admitting: Physician Assistant

## 2017-02-28 ENCOUNTER — Encounter: Payer: Self-pay | Admitting: Physician Assistant

## 2017-02-28 VITALS — BP 126/85 | HR 64 | Wt 233.4 lb

## 2017-02-28 DIAGNOSIS — F321 Major depressive disorder, single episode, moderate: Secondary | ICD-10-CM

## 2017-02-28 DIAGNOSIS — F411 Generalized anxiety disorder: Secondary | ICD-10-CM | POA: Diagnosis not present

## 2017-02-28 DIAGNOSIS — R1013 Epigastric pain: Secondary | ICD-10-CM

## 2017-02-28 DIAGNOSIS — G43A0 Cyclical vomiting, not intractable: Secondary | ICD-10-CM

## 2017-02-28 DIAGNOSIS — R1115 Cyclical vomiting syndrome unrelated to migraine: Secondary | ICD-10-CM | POA: Insufficient documentation

## 2017-02-28 MED ORDER — ESCITALOPRAM OXALATE 5 MG PO TABS: 10.0000 mg | ORAL_TABLET | Freq: Every day | ORAL | 0 refills | Status: DC

## 2017-02-28 MED ORDER — AMITRIPTYLINE HCL 10 MG PO TABS: 10.0000 mg | ORAL_TABLET | Freq: Every day | ORAL | 2 refills | Status: DC

## 2017-02-28 MED ORDER — ONDANSETRON HCL 4 MG PO TABS: 4.0000 mg | ORAL_TABLET | Freq: Three times a day (TID) | ORAL | 0 refills | Status: DC | PRN

## 2017-02-28 MED ORDER — OMEPRAZOLE 20 MG PO TBEC: 1.0000 | DELAYED_RELEASE_TABLET | Freq: Every day | ORAL | 2 refills | Status: DC

## 2017-02-28 NOTE — Patient Instructions (Addendum)
- Start Amitriptyline at bedtime - this is an antidepressant that has been shown to help in cyclic vomiting syndrome - Also start Omeprazole at bedtime - this is an antacid medication  - GERD diet - Continue your Escitalopram 2 tabs daily - Continue Zofran 1 tab dissolved under the tongue as needed for nausea and vomiting - Follow-up with me in 6 weeks - Follow-up with [facility]  Cyclic Vomiting Syndrome, Adult Cyclic vomiting syndrome (CVS) is a condition that causes episodes of severe nausea and vomiting. It can last for hours or even days. Attacks may occur several times a month or several times a year. Between episodes of CVS, you may be otherwise healthy. CVS is also called abdominal migraine. What are the causes? The cause of this condition is not known. Although many of the episodes can happen for no obvious reason, you may have specific CVS triggers. Episodes may be triggered by:  An infection, especially colds and the flu.  Emotional stress, including excitement or anxiety about upcoming events, such as school, parties, or travel.  Certain foods or beverages, such as chocolate, cheese, alcohol, and food additives.  Motion sickness.  Eating a large meal before bed.  Being very tired.  Being too hot.  What increases the risk? You are more likely to develop this condition if:  You get migraine headaches.  You have a family history of CVS or migraine headaches.  What are the signs or symptoms? Symptoms tend to happen at the same time of day, and each episode tends to last about the same amount of time. Symptoms commonly start at night or when you wake up. Many people have warning signs (prodrome) before an episode, which may include slight nausea, sweating, and pale skin (pallor). The most common symptoms of a CVS attack include:  Severe vomiting. Vomiting may happen every 5-15 minutes.  Severe nausea.  Gagging (retching).  Other symptoms may  include:  Headache.  Dizziness.  Sensitivity to light.  Extreme thirst.  Abdominal pain. This can be severe.  Loose stools or diarrhea.  Fever.  Pale skin (pallor), especially on the face.  Weakness.  Exhaustion.  Sleepiness after a CVS episode.  Dehydration. This can cause: ? Thirst. ? Dry mouth. ? Decreased urination. ? Fatigue.  How is this diagnosed? This condition may be diagnosed based on your symptoms, medical history, and family history of CVS or migraine. Your health care provider will ask whether you have had:  Episodes of severe nausea and vomiting that have happened a total of 5 or more times, or 3 or more times in the past 6 months.  Episodes that last for 1 hour or more, and occur 1 week apart or farther apart.  Episodes that are similar each time.  Normal health between episodes.  Your health care provider will also do a physical exam. To rule out other conditions, you may have tests, such as:  Blood tests.  Urine tests.  Imaging tests.  How is this treated? There is no cure for this condition, but treatment can help manage or prevent CVS episodes. Work with your health care provider to find the best treatment for you. Treatment may include:  Avoiding stress and CVS triggers.  Eating smaller, more frequent meals.  Taking medicines, such as: ? Over-the-counter pain medicine. ? Anti-nausea medicines. ? Antacids. ? Antihistamines. ? Medicines for migraines. ? Antidepressants. ? Antibiotics.  Severe nausea and vomiting may require you to stay at the hospital. You may need IV  fluids to prevent or treat dehydration. Follow these instructions at home: During an episode  Take over-the-counter and prescription medicines only as told by your health care provider.  Stay in bed and rest in a dark, quiet room. After an episode  Drink an oral rehydration solution (ORS), if directed by your health care provider. This is a drink that helps you  replace fluids and the salts and minerals in your blood (electrolytes). It can be found at pharmacies and retail stores.  Drink small amounts of clear fluids slowly and gradually add more. ? Drink clear fluids such as water or fruit juice that has water added (is diluted). You may also eat low-calorie popsicles. ? Avoid drinking fluids that contain a lot of sugar or caffeine, such as sports drinks and soda.  Eat soft foods in small amounts every 3-4 hours. Eat your regular diet, but avoid spicy or fatty foods, such as french fries and pizza. General instructions  Monitor your condition for any changes.  If you were prescribed an antibiotic medicine, take it as told by your health care provider. Do not stop taking the antibiotic even if you start to feel better.  Keep track of your attacks and symptoms, and pay attention to any triggers. Avoid those triggers when you can.  Keep all follow-up visits as told by your health care provider. This is important. Contact a health care provider if:  Your condition gets worse.  You cannot drink fluids without vomiting.  You have pain and trouble swallowing after an episode. Get help right away if:  You have blood in your vomit.  Your vomit looks like coffee grounds.  You have stools that are bloody or black, or stools that look like tar.  You have signs of dehydration, such as: ? Sunken eyes. ? Not making tears while crying. ? Very dry mouth. ? Cracked lips. ? Decreased urine production. ? Dark urine. Urine may be the color of tea. ? Weakness. ? Sleepiness. Summary  Cyclic vomiting syndrome (CVS) causes episodes of severe nausea and vomiting that can last for hours or even days.  Vomiting and diarrhea can make you feel weak and can lead to dehydration. If you notice signs of dehydration, call your health care provider right away.  Treatment can help you manage or prevent CVS episodes. Work with your health care provider to find the  best treatment for you.  Keep all follow-up visits as told by your health care provider. This is important. This information is not intended to replace advice given to you by your health care provider. Make sure you discuss any questions you have with your health care provider. Document Released: 05/25/2016 Document Revised: 05/25/2016 Document Reviewed: 05/25/2016 Elsevier Interactive Patient Education  2018 [place] for Gastroesophageal Reflux Disease, Adult When you have gastroesophageal reflux disease (GERD), the foods you eat and your eating habits are very important. Choosing the right foods can help ease your discomfort. What guidelines do I need to follow?  Choose fruits, vegetables, whole grains, and low-fat dairy products.  Choose low-fat meat, fish, and poultry.  Limit fats such as oils, salad dressings, butter, nuts, and avocado.  Keep a food diary. This helps you identify foods that cause symptoms.  Avoid foods that cause symptoms. These may be different for everyone.  Eat small meals often instead of 3 large meals a day.  Eat your meals slowly, in a place where you are relaxed.  Limit fried foods.  Lacinda Axon  foods using methods other than frying.  Avoid drinking alcohol.  Avoid drinking large amounts of liquids with your meals.  Avoid bending over or lying down until 2-3 hours after eating. What foods are not recommended? These are some foods and drinks that may make your symptoms worse: Vegetables Tomatoes. Tomato juice. Tomato and spaghetti sauce. Chili peppers. Onion and garlic. Horseradish. Fruits Oranges, grapefruit, and lemon (fruit and juice). Meats High-fat meats, fish, and poultry. This includes hot dogs, ribs, ham, sausage, salami, and bacon. Dairy Whole milk and chocolate milk. Sour cream. Cream. Butter. Ice cream. Cream cheese. Drinks Coffee and tea. Bubbly (carbonated) drinks or energy drinks. Condiments Hot sauce. Barbecue  sauce. Sweets/Desserts Chocolate and cocoa. Donuts. Peppermint and spearmint. Fats and Oils High-fat foods. This includes [place] fries and potato chips. Other Vinegar. Strong spices. This includes black pepper, white pepper, red pepper, cayenne, curry powder, cloves, ginger, and chili powder. The items listed above may not be a complete list of foods and drinks to avoid. Contact your dietitian for more information. This information is not intended to replace advice given to you by your health care provider. Make sure you discuss any questions you have with your health care provider. Document Released: 10/09/2011 Document Revised: 09/15/2015 Document Reviewed: 02/11/2013 Elsevier Interactive Patient Education  2017 [employer].

## 2017-02-28 NOTE — Progress Notes (Signed)
HPI:                                                                Troy Wilkerson is a 36 y.o. male who presents to [facility]: [place] today for anxiety and depression follow-up  Patient continues to report nausea and vomiting/wretching most mornings. No coffee ground emesis or hematemesis. Episodes only occur in the morning and resolve spontaneously. Antiemetics at bedtime did not seem to help. He denies fever, chills, change in bowel habits, melena, hematochezia. Endorses decreased appetite, epigastric pain, occasional regurgitation.   Depression/Anxiety: taking Lexapro 10mg  without difficulty. He did not tolerate higher dose. He reports no change compared to 2 weeks ago. Continues to have depressed mood, anhedonia, excessive guilt, fatigue, and occasional suicidal thoughts. Denies self-harm, forming a plan, or intent to act on suicidal thoughts. Financial/work stress continue to be the main trigger. Denies symptoms of mania/hypomania.  Denies auditory/visual hallucinations.  Past Medical History:  Diagnosis Date  . Benign tumor of thoracic site    20 mos old  . Obesity    Past Surgical History:  Procedure Laterality Date  . KNEE ARTHROSCOPY     left 2011  . THORACOTOMY     10 mos old   Social History   Tobacco Use  . Smoking status: Never Smoker  . Smokeless tobacco: Never Used  Substance Use Topics  . Alcohol use: No   family history includes Cancer in his mother; Diabetes in his mother; Heart disease in his mother; Hypertension in his brother; Stroke in his brother.  ROS: negative except as noted in the HPI  Medications: Current Outpatient Medications  Medication Sig Dispense Refill  . diclofenac sodium (VOLTAREN) 1 % GEL Apply 4 g topically 4 (four) times daily. To affected joint. 100 g 11  . escitalopram (LEXAPRO) 5 MG tablet Take 2 tablets (10 mg total) daily by mouth. 60 tablet 0  . ondansetron (ZOFRAN) 4 MG tablet Take 1 tablet  (4 mg total) every 8 (eight) hours as needed by mouth for nausea or vomiting. 20 tablet 0  . promethazine (PHENERGAN) 25 MG tablet Take 1 tablet (25 mg total) by mouth every 6 (six) hours as needed for nausea or vomiting. 30 tablet 0  . amitriptyline (ELAVIL) 10 MG tablet Take 1 tablet (10 mg total) at bedtime by mouth. 30 tablet 2  . Omeprazole 20 MG TBEC Take 1 tablet (20 mg total) at bedtime by mouth. 30 each 2   No current facility-administered medications for this visit.    Allergies  Allergen Reactions  . Cymbalta [Duloxetine Hcl] Nausea And Vomiting       Objective:  BP 126/85   Pulse 64   Wt 233 lb 7 oz (105.9 kg)   SpO2 96%   BMI 31.66 kg/m  Gen:  alert, not ill-appearing, no distress, appropriate for age, obese male HEENT: head normocephalic without obvious abnormality, conjunctiva and cornea clear, trachea midline Pulm: Normal work of breathing, normal phonation, clear to auscultation bilaterally, no wheezes, rales or rhonchi CV: Normal rate, regular rhythm, s1 and s2 distinct, no murmurs, clicks or rubs  GI: abdomen obese, soft, there is epigastric tenderness, no rebound, no guarding, negative Murphy's sign Neuro: alert and oriented x 3, no  tremor MSK: extremities atraumatic, normal gait and station Skin: intact, no rashes on exposed skin, no jaundice, no cyanosis Psych: well-groomed, cooperative, good eye contact, depressed mood, affect mood-congruent, speech is articulate, and thought processes clear and goal-directed  Depression screen [facility] 2/9 02/28/2017 02/07/2017 01/10/2017 12/27/2016 11/29/2016  Decreased Interest 3 3 2 3 2   Down, Depressed, Hopeless 2 2 1 2 1   PHQ - 2 Score 5 5 3 5 3   Altered sleeping 3 3 3 3 3   Tired, decreased energy 3 3 3 3 2   Change in appetite 2 2 2  0 2  Feeling bad or failure about yourself  2 2 1 1 1   Trouble concentrating 2 2 2 2 1   Moving slowly or fidgety/restless 1 2 2 2  0  Suicidal thoughts 1 1 0 0 0  PHQ-9 Score 19 20 16 16 12    Difficult doing work/chores - Very difficult - Somewhat difficult -    GAD 7 : Generalized Anxiety Score 02/28/2017 02/07/2017 01/10/2017  Nervous, Anxious, on Edge 3 2 3   Control/stop worrying 3 3 3   Worry too much - different things 3 3 3   Trouble relaxing 2 3 3   Restless 1 2 2   Easily annoyed or irritable 1 1 1   Afraid - awful might happen 3 1 1   Total GAD 7 Score 16 15 16   Anxiety Difficulty - Very difficult -     No results found for this or any previous visit (from the past 72 hour(s)). No results found.    Assessment and Plan: 36 y.o. male with   1. Non-intractable cyclical vomiting with nausea - patient has lost 3 pounds, not responding to scheduled antiemetics. Plan to start TCA and refer to Gastroenterology  - amitriptyline (ELAVIL) 10 MG tablet; Take 1 tablet (10 mg total) at bedtime by mouth.  Dispense: 30 tablet; Refill: 2 - Ambulatory referral to Gastroenterology - ondansetron (ZOFRAN) 4 MG tablet; Take 1 tablet (4 mg total) every 8 (eight) hours as needed by mouth for nausea or vomiting.  Dispense: 20 tablet; Refill: 0  2. Epigastric pain - GERD versus gastritis induced by vomiting. Starting PPI at bedtime - Omeprazole 20 MG TBEC; Take 1 tablet (20 mg total) at bedtime by mouth.  Dispense: 30 each; Refill: 2  3. Depression, major, single episode, moderate (HCC) - PHQ9 score 19, patient contracted for safety - continue Lexapro 10mg . Adding Amitriptyline nightly - amitriptyline (ELAVIL) 10 MG tablet; Take 1 tablet (10 mg total) at bedtime by mouth.  Dispense: 30 tablet; Refill: 2 - escitalopram (LEXAPRO) 5 MG tablet; Take 2 tablets (10 mg total) daily by mouth.  Dispense: 60 tablet; Refill: 0  4. Anxiety state - GAD7 score 16, moderately severe - Lexapro 10mg  as above. We will give Lexapro an additional 6 weeks to work. If no response, will refer to Psychiatry   Patient education and anticipatory guidance given Patient agrees with treatment plan Follow-up  in 6 weeks or sooner as needed if symptoms worsen or fail to improve  Darlyne Russian PA-C

## 2017-03-28 ENCOUNTER — Other Ambulatory Visit: Payer: Self-pay | Admitting: Emergency Medicine

## 2017-03-28 DIAGNOSIS — R1115 Cyclical vomiting syndrome unrelated to migraine: Secondary | ICD-10-CM

## 2017-03-28 DIAGNOSIS — R110 Nausea: Secondary | ICD-10-CM

## 2017-03-28 DIAGNOSIS — F321 Major depressive disorder, single episode, moderate: Secondary | ICD-10-CM

## 2017-03-28 MED ORDER — ONDANSETRON HCL 4 MG PO TABS: 4.0000 mg | ORAL_TABLET | Freq: Three times a day (TID) | ORAL | 0 refills | Status: DC | PRN

## 2017-03-28 MED ORDER — PROMETHAZINE HCL 25 MG PO TABS
25.0000 mg | ORAL_TABLET | Freq: Four times a day (QID) | ORAL | 0 refills | Status: DC | PRN
Start: 2017-03-28 — End: 2017-05-31

## 2017-03-28 MED ORDER — ESCITALOPRAM OXALATE 5 MG PO TABS: 10.0000 mg | ORAL_TABLET | Freq: Every day | ORAL | 0 refills | Status: DC

## 2017-04-11 ENCOUNTER — Encounter: Payer: Self-pay | Admitting: Physician Assistant

## 2017-04-11 ENCOUNTER — Ambulatory Visit (INDEPENDENT_AMBULATORY_CARE_PROVIDER_SITE_OTHER): Payer: BLUE CROSS/BLUE SHIELD | Admitting: Physician Assistant

## 2017-04-11 VITALS — BP 120/79 | HR 61 | Wt 242.0 lb

## 2017-04-11 DIAGNOSIS — F321 Major depressive disorder, single episode, moderate: Secondary | ICD-10-CM | POA: Diagnosis not present

## 2017-04-11 DIAGNOSIS — F418 Other specified anxiety disorders: Secondary | ICD-10-CM

## 2017-04-11 DIAGNOSIS — R1013 Epigastric pain: Secondary | ICD-10-CM | POA: Insufficient documentation

## 2017-04-11 DIAGNOSIS — R1115 Cyclical vomiting syndrome unrelated to migraine: Secondary | ICD-10-CM

## 2017-04-11 DIAGNOSIS — G43A0 Cyclical vomiting, not intractable: Secondary | ICD-10-CM | POA: Diagnosis not present

## 2017-04-11 MED ORDER — AMITRIPTYLINE HCL 25 MG PO TABS: 25.0000 mg | ORAL_TABLET | Freq: Every day | ORAL | 0 refills | Status: DC

## 2017-04-11 MED ORDER — ESCITALOPRAM OXALATE 5 MG PO TABS: 15.0000 mg | ORAL_TABLET | Freq: Every day | ORAL | 0 refills | Status: DC

## 2017-04-11 MED ORDER — OMEPRAZOLE 20 MG PO TBEC: 1.0000 | DELAYED_RELEASE_TABLET | Freq: Every day | ORAL | 0 refills | Status: DC

## 2017-04-11 NOTE — Progress Notes (Signed)
HPI:                                                                Troy Wilkerson is a 36 y.o. male who presents to [facility] today for depression/anxiety follow-up  Depression/Anxiety: taking Lexapro 10mg  daily and Amitriptyline nightly without difficulty. He feels both anxiety and depressive symptoms are improving. Continues to endorse depressed mood, anhedonia, difficulty with concentration, fatigue and sleep disturbance. Denies symptoms of mania/hypomania. Denies suicidal thinking. Denies auditory/visual hallucinations.  Cyclic vomiting: has been taking Elavil for almost 6 weeks. Still having 3-4 episodes per week in the mornings, but improving. No longer daily. Responds to antiemetics.  Epigastric pain: taking Omeprazole at bedtime. This is helping his epigastric pain. Continues to have decreased appetite and breakthrough symptoms. No coffee ground emesis, hematemesis, or melena.  Past Medical History:  Diagnosis Date  . Anxiety   . Benign tumor of thoracic site    38 mos old  . Depression   . Obesity    Past Surgical History:  Procedure Laterality Date  . KNEE ARTHROSCOPY     left 2011  . THORACOTOMY     60 mos old   Social History   Tobacco Use  . Smoking status: Never Smoker  . Smokeless tobacco: Never Used  Substance Use Topics  . Alcohol use: No   family history includes Cancer in his mother; Diabetes in his mother; Heart disease in his mother; Stroke in his brother.  ROS: negative except as noted in the HPI  Medications: Current Outpatient Medications  Medication Sig Dispense Refill  . diclofenac sodium (VOLTAREN) 1 % GEL Apply 4 g topically 4 (four) times daily. To affected joint. 100 g 11  . escitalopram (LEXAPRO) 5 MG tablet Take 3 tablets (15 mg total) by mouth daily. 270 tablet 0  . Omeprazole 20 MG TBEC Take 1 tablet (20 mg total) by mouth at bedtime. 90 each 0  . ondansetron (ZOFRAN) 4 MG tablet Take 1  tablet (4 mg total) by mouth every 8 (eight) hours as needed for nausea or vomiting. 20 tablet 0  . promethazine (PHENERGAN) 25 MG tablet Take 1 tablet (25 mg total) by mouth every 6 (six) hours as needed for nausea or vomiting. 30 tablet 0  . amitriptyline (ELAVIL) 25 MG tablet Take 1 tablet (25 mg total) by mouth at bedtime. 90 tablet 0   No current facility-administered medications for this visit.    Allergies  Allergen Reactions  . Cymbalta [Duloxetine Hcl] Nausea And Vomiting       Objective:  BP 120/79   Pulse 61   Wt 242 lb (109.8 kg)   BMI 32.82 kg/m  Gen:  alert, not ill-appearing, no distress, appropriate for age, obese male HEENT: head normocephalic without obvious abnormality, conjunctiva and cornea clear, trachea midline Pulm: Normal work of breathing, normal phonation  GI: abdomen obese, soft, there is epigastric tenderness, no rebound, no guarding, negative Murphy's sign Neuro: alert and oriented x 3, no tremor MSK: extremities atraumatic, normal gait and station Skin: intact, no rashes on exposed skin, no jaundice, no cyanosis Psych: well-groomed, cooperative, good eye contact, euthymic mood, affect mood-congruent, speech is articulate, and thought processes clear and goal-directed  Depression screen PHQ  2/9 04/11/2017 02/28/2017 02/07/2017 01/10/2017 12/27/2016  Decreased Interest 2 3 3 2 3   Down, Depressed, Hopeless 1 2 2 1 2   PHQ - 2 Score 3 5 5 3 5   Altered sleeping 2 3 3 3 3   Tired, decreased energy 2 3 3 3 3   Change in appetite 1 2 2 2  0  Feeling bad or failure about yourself  1 2 2 1 1   Trouble concentrating 2 2 2 2 2   Moving slowly or fidgety/restless 1 1 2 2 2   Suicidal thoughts 0 1 1 0 0  PHQ-9 Score 12 19 20 16 16   Difficult doing work/chores - - Very difficult - Somewhat difficult   GAD 7 : Generalized Anxiety Score 04/11/2017 02/28/2017 02/07/2017 01/10/2017  Nervous, Anxious, on Edge 2 3 2 3   Control/stop worrying 2 3 3 3   Worry too much -  different things 2 3 3 3   Trouble relaxing 2 2 3 3   Restless 1 1 2 2   Easily annoyed or irritable 1 1 1 1   Afraid - awful might happen 1 3 1 1   Total GAD 7 Score 11 16 15 16   Anxiety Difficulty - - Very difficult -       No results found for this or any previous visit (from the past 72 hour(s)). No results found.    Assessment and Plan: 36 y.o. male with   1. Epigastric pain - improved with low-dose PPI. Will try 40mg  to cover for gastritis/esophagitis. - avoid oral NSAIDs - GERD diet - Follow-up with GI - Omeprazole 20 MG TBEC; Take 1 tablet (20 mg total) by mouth at bedtime.  Dispense: 90 each; Refill: 0  2. Depression, major, single episode, moderate, Anxiety associated with depression - PHQ9 score 12, moderate; improved from 6 weeks ago, no acute safety issues - GAD7 score 11, moderate; also improved - will try up-titrating Lexapro and Amitriptyline. I think GI symptoms are more cyclic vomiting syndrome than medication intolerance - escitalopram (LEXAPRO) 5 MG tablet; Take 3 tablets (15 mg total) by mouth daily.  Dispense: 270 tablet; Refill: 0 - amitriptyline (ELAVIL) 25 MG tablet; Take 1 tablet (25 mg total) by mouth at bedtime.  Dispense: 90 tablet; Refill: 0  3. Non-intractable cyclical vomiting with nausea - referred to digestive health. Has not scheduled an appointment. I am reassured that he is responding to TCA, but am more concerned about gastritis/esophagitis - amitriptyline (ELAVIL) 25 MG tablet; Take 1 tablet (25 mg total) by mouth at bedtime.  Dispense: 90 tablet; Refill: 0   Patient education and anticipatory guidance given Patient agrees with treatment plan Follow-up in 8 weeks for anxiety and depression or sooner as needed if symptoms worsen or fail to improve  Darlyne Russian PA-C

## 2017-04-11 NOTE — Patient Instructions (Addendum)
-   re-start elbow sleeves at bedtime  - increase Omeprazole to 40mg  (2 capsules) at bedtime  - increase Lexapro to 3 tabs (15 mg ) every morning  - continue Amitriptyline at bedtime, 1 pill (25 mg)

## 2017-04-17 ENCOUNTER — Encounter: Payer: Self-pay | Admitting: Physician Assistant

## 2017-04-17 ENCOUNTER — Ambulatory Visit: Payer: BLUE CROSS/BLUE SHIELD | Admitting: Physician Assistant

## 2017-04-17 VITALS — BP 114/76 | HR 80 | Temp 98.3°F | Resp 16 | Ht 72.0 in | Wt 240.0 lb

## 2017-04-17 DIAGNOSIS — R51 Headache: Secondary | ICD-10-CM

## 2017-04-17 DIAGNOSIS — R519 Headache, unspecified: Secondary | ICD-10-CM | POA: Insufficient documentation

## 2017-04-17 MED ORDER — KETOROLAC TROMETHAMINE 30 MG/ML IJ SOLN
30.0000 mg | Freq: Once | INTRAMUSCULAR | Status: AC
  Administered 2017-04-17: 30 mg via INTRAVENOUS

## 2017-04-17 MED ORDER — DEXAMETHASONE SODIUM PHOSPHATE 4 MG/ML IJ SOLN
4.0000 mg | Freq: Once | INTRAMUSCULAR | Status: AC
  Administered 2017-04-17: 4 mg via INTRAMUSCULAR

## 2017-04-17 NOTE — Progress Notes (Signed)
HPI:                                                                Troy Wilkerson is a 36 y.o. male who presents to [facility]: [facility] today for headache  Patient with PMH of anxiety, depression, cyclic vomiting reports right-sided headache x 5 days. Pain is mostly constant and dull but also occasionally throbbing with right eye watering for seconds to a minute rated as 8/10 at most intense. Episodes occur 20-40 times per day. Endorses "mild" photophobia and phonophobia. No history of headache disorder. Denies fever, chills, neck stiffness. Denies diplopia/vision change, weakness, dizziness, gait disturbance, loss of consciousness.  Has tried applying ice, which he states does relieve the headache.   Past Medical History:  Diagnosis Date  . Benign tumor of thoracic site    20 mos old  . Obesity    Past Surgical History:  Procedure Laterality Date  . KNEE ARTHROSCOPY     left 2011  . THORACOTOMY     81 mos old   Social History   Tobacco Use  . Smoking status: Never Smoker  . Smokeless tobacco: Never Used  Substance Use Topics  . Alcohol use: No   family history includes Cancer in his mother; Diabetes in his mother; Heart disease in his mother; Stroke in his brother.  ROS: negative except as noted in the HPI  Medications: Current Outpatient Medications  Medication Sig Dispense Refill  . amitriptyline (ELAVIL) 25 MG tablet Take 1 tablet (25 mg total) by mouth at bedtime. 90 tablet 0  . diclofenac sodium (VOLTAREN) 1 % GEL Apply 4 g topically 4 (four) times daily. To affected joint. 100 g 11  . escitalopram (LEXAPRO) 5 MG tablet Take 3 tablets (15 mg total) by mouth daily. 270 tablet 0  . Omeprazole 20 MG TBEC Take 1 tablet (20 mg total) by mouth at bedtime. 90 each 0  . ondansetron (ZOFRAN) 4 MG tablet Take 1 tablet (4 mg total) by mouth every 8 (eight) hours as needed for nausea or vomiting. 20 tablet 0  . promethazine (PHENERGAN) 25 MG  tablet Take 1 tablet (25 mg total) by mouth every 6 (six) hours as needed for nausea or vomiting. 30 tablet 0   No current facility-administered medications for this visit.    Allergies  Allergen Reactions  . Cymbalta [Duloxetine Hcl] Nausea And Vomiting       Objective:  BP 114/76   Pulse 80   Temp 98.3 F (36.8 C) (Oral)   Resp 16   Ht 6' (1.829 m)   Wt 240 lb (108.9 kg)   SpO2 97%   BMI 32.55 kg/m  Gen: well-groomed, not ill-appearing, no acute distress, obese male HEENT: head normocephalic, atraumatic; conjunctiva and cornea clear, oropharynx clear, moist mucus membranes; neck supple, no meningeal signs Pulm: Normal work of breathing, normal phonation, clear to auscultation bilaterally CV: Normal rate, regular rhythm, s1 and s2 distinct, no murmurs, clicks or rubs Neuro:  cranial nerves II-XII intact, no nystagmus, normal finger-to-nose, normal heel-to-shin, negative pronator drift, normal coordination, DTR's intact, normal tone, no tremor MSK: strength 5/5 and symmetric in bilateral upper and lower extremities, normal gait and station, negative Romberg Mental Status: alert and oriented x  3, speech articulate, and thought processes clear and goal-directed   Depression screen [facility] 2/9 04/11/2017 02/28/2017 02/07/2017 01/10/2017 12/27/2016  Decreased Interest 2 3 3 2 3   Down, Depressed, Hopeless 1 2 2 1 2   PHQ - 2 Score 3 5 5 3 5   Altered sleeping 2 3 3 3 3   Tired, decreased energy 2 3 3 3 3   Change in appetite 1 2 2 2  0  Feeling bad or failure about yourself  1 2 2 1 1   Trouble concentrating 2 2 2 2 2   Moving slowly or fidgety/restless 1 1 2 2 2   Suicidal thoughts 0 1 1 0 0  PHQ-9 Score 12 19 20 16 16   Difficult doing work/chores - - Very difficult - Somewhat difficult     No results found for this or any previous visit (from the past 72 hour(s)). No results found.    Assessment and Plan: 36 y.o. male with   1. Acute nonintractable headache, unspecified headache  type - reassuring neurologic exam, no focal deficits or symptoms. [place] score 0, SAH can be ruled out. Headache has mixed features of cluster and migraine headache. Treated with migraine cocktail in office. Close follow-up in 3-5 days. - ketorolac (TORADOL) 30 MG/ML injection 30 mg - dexamethasone (DECADRON) injection 4 mg   Patient education and anticipatory guidance given Patient agrees with treatment plan Follow-up in 3-5 days or sooner as needed if symptoms worsen or fail to improve  Darlyne Russian PA-C

## 2017-04-17 NOTE — Patient Instructions (Signed)
Cluster Headache A cluster headache is a type of headache that causes deep, intense head pain. Cluster headaches can last from 15 minutes to 3 hours. They usually occur:  On one side of the head. They may occur on the other side when a new cluster of headaches begins.  Repeatedly over weeks to months.  Several times a day.  At the same time of day, often at night.  More often in the fall and springtime.  What are the causes? The cause of this condition is not known. What increases the risk? This condition is more likely to develop in:  Males.  People who drink alcohol.  People who smoke or use products that contain nicotine or tobacco.  People who take medicines that cause blood vessels to expand, such as nitroglycerin.  People who take antihistamines.  What are the signs or symptoms? Symptoms of this condition include:  Severe pain on one side of the head that begins behind or around your eye or temple.  Pain on one side of the head.  Nausea.  Sensitivity to light.  Runny nose and nasal stuffiness.  Sweaty, pale skin on the face.  Droopy or swollen eyelid, eye redness, or tearing.  Restlessness and agitation.  How is this diagnosed? This condition may be diagnosed based on:  Your symptoms.  A physical exam.  Your health care provider may order tests to see if your headaches are caused by another medical condition. These tests may show that you do not have cluster headaches. Tests may include:  A CT scan of your head.  An MRI of your head.  Lab tests.  How is this treated? This condition may be treated with:  Medicines to relieve pain and to prevent repeated (recurrent) attacks. Some people may need a combination of medicines.  Oxygen. This helps to relieve pain.  Follow these instructions at home: Headache diary Keep a headache diary as told by your health care provider. Doing this can help you and your health care provider figure out what  triggers your headaches. In your headache diary, include information about:  The time of day that your headache started and what you were doing when it began.  How long your headache lasted.  Where your pain started and whether it moved to other areas.  The type of pain, such as burning, stabbing, throbbing, or cramping.  Your level of pain. Use a pain scale and rate the pain with a number from 1 (mild) up to 10 (severe).  The treatment that you used, and any change in symptoms after treatment.  Medicines  Take over-the-counter and prescription medicines only as told by your health care provider.  Do not drive or use heavy machinery while taking prescription pain medicine.  Use oxygen as told by your health care provider. Lifestyle  Follow a regular sleep schedule. Do not vary the time that you go to bed or the amount that you sleep from day to day. It is important to stay on the same schedule during a cluster period to help prevent headaches.  Exercise regularly.  Eat a healthy diet and avoid foods that may trigger your headaches.  Avoid alcohol.  Do not use any products that contain nicotine or tobacco, such as cigarettes and e-cigarettes. If you need help quitting, ask your health care provider. Contact a health care provider if:  Your headaches change, become more severe, or occur more often.  The medicine or oxygen that your health care provider recommended does  not help. Get help right away if:  You faint.  You have weakness or numbness, especially on one side of your body or face.  You have double vision.  You have nausea or vomiting that does not go away within several hours.  You have trouble talking, walking, or keeping your balance.  You have pain or stiffness in your neck.  You have a fever. Summary  A cluster headache is a type of headache that causes deep, intense head pain, usually on one side of the head.  Keep a headache diary to help discover  what triggers your headaches.  A regular sleep schedule can help prevent headaches. This information is not intended to replace advice given to you by your health care provider. Make sure you discuss any questions you have with your health care provider. Document Released: 04/09/2005 Document Revised: 12/20/2015 Document Reviewed: 12/20/2015 Elsevier Interactive Patient Education  [employer].

## 2017-04-21 ENCOUNTER — Encounter: Payer: Self-pay | Admitting: Physician Assistant

## 2017-04-22 ENCOUNTER — Ambulatory Visit: Payer: BLUE CROSS/BLUE SHIELD | Admitting: Physician Assistant

## 2017-04-22 ENCOUNTER — Encounter: Payer: Self-pay | Admitting: Physician Assistant

## 2017-04-22 VITALS — BP 135/91 | HR 64 | Temp 98.3°F | Wt 245.2 lb

## 2017-04-22 DIAGNOSIS — J019 Acute sinusitis, unspecified: Secondary | ICD-10-CM

## 2017-04-22 DIAGNOSIS — J3089 Other allergic rhinitis: Secondary | ICD-10-CM | POA: Diagnosis not present

## 2017-04-22 DIAGNOSIS — B9689 Other specified bacterial agents as the cause of diseases classified elsewhere: Secondary | ICD-10-CM

## 2017-04-22 DIAGNOSIS — R51 Headache: Secondary | ICD-10-CM

## 2017-04-22 DIAGNOSIS — R519 Headache, unspecified: Secondary | ICD-10-CM

## 2017-04-22 MED ORDER — CEFUROXIME AXETIL 250 MG PO TABS: 250.0000 mg | ORAL_TABLET | Freq: Two times a day (BID) | ORAL | 0 refills | Status: AC

## 2017-04-22 MED ORDER — FLUTICASONE PROPIONATE 50 MCG/ACT NA SUSP: 1.0000 | Freq: Every day | NASAL | 1 refills | Status: DC

## 2017-04-22 NOTE — Patient Instructions (Signed)
Sinus Headache A sinus headache occurs when the paranasal sinuses become clogged or swollen. Paranasal sinuses are air pockets within the bones of the face. Sinus headaches can range from mild to severe. What are the causes? A sinus headache can result from various conditions that affect the sinuses, such as:  Colds.  Sinus infections.  Allergies.  What are the signs or symptoms? The main symptom of this condition is a headache that may feel like pain or pressure in the face, forehead, ears, or upper teeth. People who have a sinus headache often have other symptoms, such as:  Congested or runny nose.  Fever.  Inability to smell.  Weather changes can make symptoms worse. How is this diagnosed? This condition may be diagnosed based on:  A physical exam and medical history.  Imaging tests, such as a CT scan and MRI, to check for problems with the sinuses.  A specialist may look into the sinuses with a tool that has a camera (endoscopy).  How is this treated? Treatment for this condition depends on the cause.  Sinus pain that is caused by a sinus infection may be treated with antibiotic medicine.  Sinus pain that is caused by allergies may be helped by allergy medicines (antihistamines) and medicated nasal sprays.  Sinus pain that is caused by congestion may be helped by flushing the nose and sinuses with saline solution.  Follow these instructions at home:  Take medicines only as directed by your health care provider.  If you were prescribed an antibiotic medicine, finish all of it even if you start to feel better.  If you have congestion, use a nasal spray to help reduce pressure.  If directed, apply a warm, moist washcloth to your face to help relieve pain. Contact a health care provider if:  You have headaches more than one time each week.  You have sensitivity to light or sound.  You have a fever.  You feel sick to your stomach (nauseous) or you throw up  (vomit).  Your headaches do not get better with treatment. Many people think that they have a sinus headache when they actually have migraines or tension headaches. Get help right away if:  You have vision problems.  You have sudden, severe pain in your face or head.  You have a seizure.  You are confused.  You have a stiff neck. This information is not intended to replace advice given to you by your health care provider. Make sure you discuss any questions you have with your health care provider. Document Released: 05/17/2004 Document Revised: 12/04/2015 Document Reviewed: 04/05/2014 Elsevier Interactive Patient Education  2018 [employer] Inc.  

## 2017-04-22 NOTE — Progress Notes (Signed)
HPI:                                                                Troy Wilkerson is a 36 y.o. male who presents to [facility]: [place] today for headache follow-up  Patient states headache resolved with Decadron and Toradol injection 5 days ago. Reports bilateral ear fullness beginning Saturday. States today he woke up with a dull right-sided headache. Endorses photophobia and nausea. Also reports nasal congestion and sinus pressure, worse on the right side. States congestion is a chronic problem. He has not taken anything OTC for headache or sinuses. Denies fever, neck stiffness, or other respiratory symptoms. Denies vision change, diplopia, paresthesias, weakness, gait disturbance, loss of consciousness.   Past Medical History:  Diagnosis Date  . Anxiety   . Benign tumor of thoracic site    43 mos old  . Depression   . Obesity    Past Surgical History:  Procedure Laterality Date  . KNEE ARTHROSCOPY     left 2011  . THORACOTOMY     90 mos old   Social History   Tobacco Use  . Smoking status: Never Smoker  . Smokeless tobacco: Never Used  Substance Use Topics  . Alcohol use: No   family history includes Cancer in his mother; Diabetes in his mother; Heart disease in his mother; Stroke in his brother.  ROS: negative except as noted in the HPI  Medications: Current Outpatient Medications  Medication Sig Dispense Refill  . amitriptyline (ELAVIL) 25 MG tablet Take 1 tablet (25 mg total) by mouth at bedtime. 90 tablet 0  . diclofenac sodium (VOLTAREN) 1 % GEL Apply 4 g topically 4 (four) times daily. To affected joint. 100 g 11  . escitalopram (LEXAPRO) 5 MG tablet Take 3 tablets (15 mg total) by mouth daily. 270 tablet 0  . Omeprazole 20 MG TBEC Take 1 tablet (20 mg total) by mouth at bedtime. 90 each 0  . ondansetron (ZOFRAN) 4 MG tablet Take 1 tablet (4 mg total) by mouth every 8 (eight) hours as needed for nausea or vomiting. 20 tablet 0   . promethazine (PHENERGAN) 25 MG tablet Take 1 tablet (25 mg total) by mouth every 6 (six) hours as needed for nausea or vomiting. 30 tablet 0   No current facility-administered medications for this visit.    Allergies  Allergen Reactions  . Cymbalta [Duloxetine Hcl] Nausea And Vomiting       Objective:  BP (!) 135/91   Pulse 64   Temp 98.3 F (36.8 C) (Oral)   Wt 245 lb 3.2 oz (111.2 kg)   BMI 33.26 kg/m  Gen:  alert, not ill-appearing, no distress, appropriate for age, obese male HEENT: head normocephalic without obvious abnormality, conjunctiva and cornea clear, wearing glasses, TM's clear bilaterally, nasal mucosa edematous, there is right sided maxillary and sinus tenderness, neck supple, no adenopathy, trachea midline Pulm: Normal work of breathing, normal phonation Neuro: alert and oriented x 3, no tremor MSK: extremities atraumatic, normal gait and station Skin: intact, no rashes on exposed skin, no jaundice, no cyanosis Psych: well-groomed, cooperative, good eye contact, euthymic mood, affect mood-congruent, speech is articulate, and thought processes clear and goal-directed  Depression screen [facility] 2/9 04/11/2017 02/28/2017  02/07/2017 01/10/2017 12/27/2016  Decreased Interest 2 3 3 2 3   Down, Depressed, Hopeless 1 2 2 1 2   PHQ - 2 Score 3 5 5 3 5   Altered sleeping 2 3 3 3 3   Tired, decreased energy 2 3 3 3 3   Change in appetite 1 2 2 2  0  Feeling bad or failure about yourself  1 2 2 1 1   Trouble concentrating 2 2 2 2 2   Moving slowly or fidgety/restless 1 1 2 2 2   Suicidal thoughts 0 1 1 0 0  PHQ-9 Score 12 19 20 16 16   Difficult doing work/chores - - Very difficult - Somewhat difficult     No results found for this or any previous visit (from the past 72 hour(s)). No results found.    Assessment and Plan: 36 y.o. male with   1. Acute bacterial sinusitis - cefUROXime (CEFTIN) 250 MG tablet; Take 1 tablet (250 mg total) by mouth 2 (two) times daily with a  meal for 10 days.  Dispense: 20 tablet; Refill: 0  2. Chronic non-seasonal allergic rhinitis - fluticasone (FLONASE) 50 MCG/ACT nasal spray; Place 1 spray into both nostrils daily.  Dispense: 16 g; Refill: 1  3. Nonintractable episodic headache - will treat for sinusitis and treat as sinus headache. It does have some features of migraine, but no red flag symptoms or neurologic deficits. No indication for imaging at this point - symptomatic management with OTC pain reliever of choice   Patient education and anticipatory guidance given Patient agrees with treatment plan Follow-up as needed if symptoms worsen or fail to improve  Darlyne Russian PA-C

## 2017-04-27 ENCOUNTER — Encounter: Payer: Self-pay | Admitting: Physician Assistant

## 2017-05-31 ENCOUNTER — Ambulatory Visit: Payer: BLUE CROSS/BLUE SHIELD | Admitting: Physician Assistant

## 2017-05-31 ENCOUNTER — Encounter: Payer: Self-pay | Admitting: Physician Assistant

## 2017-05-31 VITALS — BP 119/79 | HR 60 | Wt 251.0 lb

## 2017-05-31 DIAGNOSIS — F321 Major depressive disorder, single episode, moderate: Secondary | ICD-10-CM | POA: Diagnosis not present

## 2017-05-31 DIAGNOSIS — J3089 Other allergic rhinitis: Secondary | ICD-10-CM

## 2017-05-31 DIAGNOSIS — F418 Other specified anxiety disorders: Secondary | ICD-10-CM

## 2017-05-31 DIAGNOSIS — E6609 Other obesity due to excess calories: Secondary | ICD-10-CM | POA: Diagnosis not present

## 2017-05-31 DIAGNOSIS — R635 Abnormal weight gain: Secondary | ICD-10-CM

## 2017-05-31 DIAGNOSIS — G43A0 Cyclical vomiting, not intractable: Secondary | ICD-10-CM | POA: Diagnosis not present

## 2017-05-31 DIAGNOSIS — R1115 Cyclical vomiting syndrome unrelated to migraine: Secondary | ICD-10-CM

## 2017-05-31 DIAGNOSIS — T50905A Adverse effect of unspecified drugs, medicaments and biological substances, initial encounter: Secondary | ICD-10-CM

## 2017-05-31 MED ORDER — NORTRIPTYLINE HCL 25 MG PO CAPS: 25.0000 mg | ORAL_CAPSULE | Freq: Every day | ORAL | 5 refills | Status: DC

## 2017-05-31 NOTE — Patient Instructions (Addendum)
-   switch to Nortriptyline at bedtime - measure your calories - aim for 1800-2000 calorie diet - high protein (90g) and low carb (30g per meal) - prescription for exercise: moderate intensity aerobic exercise at least 2 hours per week  Physical Activity Recommendations for modifying lipids and lowering blood pressure Engage in aerobic physical activity to reduce LDL-cholesterol, non-HDL-cholesterol, and blood pressure  Frequency: 3-4 sessions per week  Intensity: moderate to vigorous  Duration: 40 minutes on average  Physical Activity Recommendations for secondary prevention 1. Aerobic exercise  Frequency: 3-5 sessions per week  Intensity: 50-80% capacity  Duration: 20 - 60 minutes  Examples: walking, treadmill, cycling, rowing, stair climbing, and arm/leg ergometry  2. Resistance exercise  Frequency: 2-3 sessions per week  Intensity: 10-15 repetitions/set to moderate fatigue  Duration: 1-3 sets of 8-10 upper and lower body exercises  Examples: calisthenics, elastic bands, cuff/hand weights, dumbbels, free weights, wall pulleys, and weight machines  Heart-Healthy Lifestyle  Eating a diet rich in vegetables, fruits and whole grains: also includes low-fat dairy products, poultry, fish, legumes, and nuts; limit intake of sweets, sugar-sweetened beverages and red meats  Getting regular exercise  Maintaining a healthy weight  Not smoking or getting help quitting  Staying on top of your health; for some people, lifestyle changes alone may not be enough to prevent a heart attack or stroke. In these cases, taking a statin at the right dose will most likely be necessary

## 2017-05-31 NOTE — Progress Notes (Signed)
HPI:                                                                Troy Wilkerson is a 37 y.o. male who presents to [place]: [place] today for follow-up depression and headaches  Depression/Anxiety: taking Lexapro 15 mg daily and Amitriptyline nightly without difficulty. Reports mood overall feels "good." He has noticed he is in a better mood when he has something to look forward to. He reports vomiting has resolved, but he has noted weight gain. Denies symptoms of mania/hypomania. Denies suicidal thinking. Denies auditory/visual hallucinations.  Sinus congestion: using Flonase daily. Continues to endorse fullness in his right maxillary sinus even after antibiotic therapy. Denies history of chronic sinusitis, deviated septum. Still has tonsil and adenoids. Endorses snoring. Denies fever, chills, post-nasal drip and cough.   Depression screen [facility] 2/9 05/31/2017 04/11/2017 02/28/2017 02/07/2017 01/10/2017  Decreased Interest 1 2 3 3 2   Down, Depressed, Hopeless 1 1 2 2 1   PHQ - 2 Score 2 3 5 5 3   Altered sleeping 1 2 3 3 3   Tired, decreased energy 2 2 3 3 3   Change in appetite 2 1 2 2 2   Feeling bad or failure about yourself  1 1 2 2 1   Trouble concentrating 2 2 2 2 2   Moving slowly or fidgety/restless 1 1 1 2 2   Suicidal thoughts 0 0 1 1 0  PHQ-9 Score 11 12 19 20 16   Difficult doing work/chores Somewhat difficult - - Very difficult -    GAD 7 : Generalized Anxiety Score 05/31/2017 04/11/2017 02/28/2017 02/07/2017  Nervous, Anxious, on Edge 2 2 3 2   Control/stop worrying 2 2 3 3   Worry too much - different things 2 2 3 3   Trouble relaxing 2 2 2 3   Restless 1 1 1 2   Easily annoyed or irritable 1 1 1 1   Afraid - awful might happen 1 1 3 1   Total GAD 7 Score 11 11 16 15   Anxiety Difficulty - - - Very difficult      Past Medical History:  Diagnosis Date  . Anxiety   . Benign tumor of thoracic site    56 mos old  . Depression   . Obesity     Past Surgical History:  Procedure Laterality Date  . KNEE ARTHROSCOPY     left 2011  . THORACOTOMY     25 mos old   Social History   Tobacco Use  . Smoking status: Never Smoker  . Smokeless tobacco: Never Used  Substance Use Topics  . Alcohol use: No   family history includes Cancer in his mother; Diabetes in his mother; Heart disease in his mother; Stroke in his brother.    ROS: negative except as noted in the HPI  Medications: Current Outpatient Medications  Medication Sig Dispense Refill  . escitalopram (LEXAPRO) 5 MG tablet Take 3 tablets (15 mg total) by mouth daily. 270 tablet 0  . fluticasone (FLONASE) 50 MCG/ACT nasal spray Place 1 spray into both nostrils daily. 16 g 1  . Omeprazole 20 MG TBEC Take 1 tablet (20 mg total) by mouth at bedtime. 90 each 0  . nortriptyline (PAMELOR) 25 MG capsule Take 1 capsule (25 mg  total) by mouth at bedtime. 30 capsule 5   No current facility-administered medications for this visit.    Allergies  Allergen Reactions  . Cymbalta [Duloxetine Hcl] Nausea And Vomiting       Objective:  BP 119/79   Pulse 60   Wt 251 lb (113.9 kg)   BMI 34.04 kg/m  Gen:  alert, not ill-appearing, no distress, appropriate for age, obese male HEENT: head normocephalic without obvious abnormality, conjunctiva and cornea clear, wearing glasses, TM's clear bilaterally, oropharynx clear, nasal mucosa edematous, no sinus tenderness, neck supple, no adenopathy, trachea midline Pulm: Normal work of breathing, normal phonation Neuro: alert and oriented x 3, no tremor MSK: extremities atraumatic, normal gait and station Skin: intact, no rashes on exposed skin, no jaundice, no cyanosis Psych: well-groomed, cooperative, good eye contact, euthymic mood, affect mood-congruent, speech is articulate, and thought processes clear and goal-directed    No results found for this or any previous visit (from the past 72 hour(s)). No results  found.    Assessment and Plan: 37 y.o. male with   1. Major depression in partial remission (HCC) - PHQ2 =2, PHQ9=11, no acute safety issues - continue Lexapro 15 mg daily - discussed switching from Elavil to Pamelor due to weight gain - nortriptyline (PAMELOR) 25 MG capsule; Take 1 capsule (25 mg total) by mouth at bedtime.  Dispense: 30 capsule; Refill: 5  2. Anxiety associated with depression - GAD7 score 11, moderate, stable - cont Lexapro 15 mg daily  3. Non-intractable cyclical vomiting with nausea - well controlled on TCA - nortriptyline (PAMELOR) 25 MG capsule; Take 1 capsule (25 mg total) by mouth at bedtime.  Dispense: 30 capsule; Refill: 5   4. Abnormal weight gain Wt Readings from Last 3 Encounters:  05/31/17 251 lb (113.9 kg)  04/22/17 245 lb 3.2 oz (111.2 kg)  04/17/17 240 lb (108.9 kg)  - I think weight gain is attributed mostly to the control of his cyclic vomiting and his ability to eat a regular diet again - food diary - discussed 1800-2000 calorie diet, 90g of protein, low--carb - prescription for 120 minutes of aerobic exercise weekly   5. Class 1 obesity due to excess calories without serious comorbidity in adult, unspecified BMI - counseled on weight loss through decreased caloric intake and increased aerobic exercise  6. Chronic non-seasonal allergic rhinitis - no evidence of sinusitis on exam - cont Flonase - add antihistamine of choice (Allegra, Zyrtec or Claritin) - add nasal saline irrigation - if recurrent sinusitis, will refer to ENT  Patient education and anticipatory guidance given Patient agrees with treatment plan Follow-up as needed if symptoms worsen or fail to improve  Darlyne Russian PA-C

## 2017-06-28 ENCOUNTER — Ambulatory Visit: Payer: BLUE CROSS/BLUE SHIELD | Admitting: Physician Assistant

## 2017-06-28 ENCOUNTER — Encounter: Payer: Self-pay | Admitting: Physician Assistant

## 2017-06-28 ENCOUNTER — Telehealth: Payer: Self-pay | Admitting: Physician Assistant

## 2017-06-28 VITALS — BP 124/85 | HR 78 | Wt 251.0 lb

## 2017-06-28 DIAGNOSIS — Z79899 Other long term (current) drug therapy: Secondary | ICD-10-CM | POA: Diagnosis not present

## 2017-06-28 DIAGNOSIS — G4719 Other hypersomnia: Secondary | ICD-10-CM | POA: Diagnosis not present

## 2017-06-28 DIAGNOSIS — Q385 Congenital malformations of palate, not elsewhere classified: Secondary | ICD-10-CM | POA: Diagnosis not present

## 2017-06-28 DIAGNOSIS — G43A0 Cyclical vomiting, not intractable: Secondary | ICD-10-CM

## 2017-06-28 DIAGNOSIS — R1115 Cyclical vomiting syndrome unrelated to migraine: Secondary | ICD-10-CM

## 2017-06-28 DIAGNOSIS — F321 Major depressive disorder, single episode, moderate: Secondary | ICD-10-CM

## 2017-06-28 DIAGNOSIS — Q386 Other congenital malformations of mouth: Secondary | ICD-10-CM

## 2017-06-28 DIAGNOSIS — F418 Other specified anxiety disorders: Secondary | ICD-10-CM

## 2017-06-28 DIAGNOSIS — R0683 Snoring: Secondary | ICD-10-CM

## 2017-06-28 MED ORDER — DESVENLAFAXINE FUMARATE ER 50 MG PO TB24: 50.0000 mg | ORAL_TABLET | Freq: Every day | ORAL | 3 refills | Status: DC

## 2017-06-28 MED ORDER — NORTRIPTYLINE HCL 50 MG PO CAPS: 50.0000 mg | ORAL_CAPSULE | Freq: Every day | ORAL | 5 refills | Status: DC

## 2017-06-28 NOTE — Patient Instructions (Addendum)
For mood: - start Desvenlafaxine (Pristiq) in the morning - increase your Nortriptyline to 50 mg at bedtime (you can double up on the capsules you have leftover and then start the new prescription) - reduce Escitalopram to 2 tablets for 3 days, then 1 tablet for 3 days then stop - prescription for exercise: 20-30 minute walk, at least 3 days per week - follow-up in 4 weeks   RESOURCES  Counseling: - psychologytoday.com: search engine to locate local counselors - Family Services in your county offer counseling on a sliding scale (pay what you can afford) - [facility]: we can place a referral for you to see one of licensed counselors in [place], [place], or [place] - online counseling: [facility] (not covered by insurance, but affordable self-pay rates)  Other resources: - https://www.[place].net/ - themighty.com - 7cupsoftea - ArmyDictionary.fi  Safety Plan: if having self-harm or suicidal thoughts [facility] 1-800-SUICIDE If in immediate danger of harming yourself, go to the nearest emergency room or call 911

## 2017-06-28 NOTE — Progress Notes (Signed)
HPI:                                                                Troy Wilkerson is a 37 y.o. male who presents to [place]: [place] today for anxiety and depression follow-up  Depression/Anxiety: taking Escitalopram daily and Pamelor nightly without difficulty. In the last 2-3 weeks, since returning from his trip to [place], he has noticed recurrence of depressed mood and anhedonia. He feels tired all day despite adequate rest. Denies symptoms of mania/hypomania. Denies suicidal thinking. Denies auditory/visual hallucinations.  He also reports throat feels dry and he has occasional difficulty swallowing. He snores loudly and is a mouth breather. Denies periods of apnea.   Depression screen [facility] 2/9 06/28/2017 05/31/2017 04/11/2017 02/28/2017 02/07/2017  Decreased Interest 3 1 2 3 3   Down, Depressed, Hopeless 2 1 1 2 2   PHQ - 2 Score 5 2 3 5 5   Altered sleeping 2 1 2 3 3   Tired, decreased energy 3 2 2 3 3   Change in appetite 2 2 1 2 2   Feeling bad or failure about yourself  2 1 1 2 2   Trouble concentrating 1 2 2 2 2   Moving slowly or fidgety/restless 1 1 1 1 2   Suicidal thoughts 1 0 0 1 1  PHQ-9 Score 17 11 12 19 20   Difficult doing work/chores - Somewhat difficult - - Very difficult    GAD 7 : Generalized Anxiety Score 06/28/2017 05/31/2017 04/11/2017 02/28/2017  Nervous, Anxious, on Edge 2 2 2 3   Control/stop worrying 2 2 2 3   Worry too much - different things 3 2 2 3   Trouble relaxing 2 2 2 2   Restless 1 1 1 1   Easily annoyed or irritable 0 1 1 1   Afraid - awful might happen 0 1 1 3   Total GAD 7 Score 10 11 11 16   Anxiety Difficulty - - - -      Past Medical History:  Diagnosis Date  . Anxiety   . Benign tumor of thoracic site    16 mos old  . Depression   . Obesity    Past Surgical History:  Procedure Laterality Date  . KNEE ARTHROSCOPY     left 2011  . THORACOTOMY     45 mos old   Social History   Tobacco Use  . Smoking status:  Never Smoker  . Smokeless tobacco: Never Used  Substance Use Topics  . Alcohol use: No   family history includes Cancer in his mother; Diabetes in his mother; Heart disease in his mother; Stroke in his brother.    ROS: negative except as noted in the HPI  Medications: Current Outpatient Medications  Medication Sig Dispense Refill  . fluticasone (FLONASE) 50 MCG/ACT nasal spray Place 1 spray into both nostrils daily. 16 g 1  . nortriptyline (PAMELOR) 25 MG capsule Take 1 capsule (25 mg total) by mouth at bedtime. 30 capsule 5  . Omeprazole 20 MG TBEC Take 1 tablet (20 mg total) by mouth at bedtime. 90 each 0   No current facility-administered medications for this visit.    Allergies  Allergen Reactions  . Cymbalta [Duloxetine Hcl] Nausea And Vomiting       Objective:  BP  124/85   Pulse 78   Wt 251 lb (113.9 kg)   BMI 34.04 kg/m  Gen:  alert, not ill-appearing, no distress, appropriate for age, obese male HEENT: head normocephalic without obvious abnormality, conjunctiva and cornea clear, wearing glasses, oropharynx clear, uvula is elongated, tonsils are 1+, no edema or exudates, neck supple, no cervical adenopathy, trachea midline Pulm: Normal work of breathing, normal phonation, clear to auscultation bilaterally CV: Normal rate, regular rhythm, s1 and s2 distinct, no murmurs, clicks or rubs  Neuro: alert and oriented x 3, no tremor MSK: extremities atraumatic, normal gait and station Skin: intact, no rashes on exposed skin, no jaundice, no cyanosis Psych: well-groomed, cooperative, good eye contact, depressed mood, flat affect, speech is articulate, and thought processes clear and goal-directed  No results found for this or any previous visit (from the past 72 hour(s)). No results found.    Assessment and Plan: 36 y.o. male with   1. Non-intractable cyclical vomiting with nausea - increasing to 50 mg nightly - nortriptyline (PAMELOR) 50 MG capsule; Take 1 capsule  (50 mg total) by mouth at bedtime.  Dispense: 30 capsule; Refill: 5  2. Depression, major, single episode, moderate (HCC) - PHQ9=17, worsened compared to 4 weeks ago, no acute safety issues. I do not think he is having a therapeutic response to the Lexapro. He still does not want to do CBT due to cost. Will taper off of Lexapro and start Pristiq. Prescription for exercise - Desvenlafaxine Fumarate ER 50 MG TB24; Take 50 mg by mouth daily.  Dispense: 30 tablet; Refill: 3 - nortriptyline (PAMELOR) 50 MG capsule; Take 1 capsule (50 mg total) by mouth at bedtime.  Dispense: 30 capsule; Refill: 5  3. Anxiety associated with depression - GAD7=10 - Desvenlafaxine Fumarate ER 50 MG TB24; Take 50 mg by mouth daily.  Dispense: 30 tablet; Refill: 3  4. Encounter for medication management  5. Habitual snoring, Excessive daytime sleepiness - discussed possibility of obstructive sleep apnea. If he continues to have excessive daytime sleepiness I will send him for a sleep study and possible ENT eval  Patient education and anticipatory guidance given Patient agrees with treatment plan Follow-up in 4 weeks or sooner as needed if symptoms worsen or fail to improve  Darlyne Russian PA-C

## 2017-06-28 NOTE — Telephone Encounter (Signed)
Received fax from [employer] that Desvenlafaxine Fumarate requires a PA. Information has been sent to the insurance company. Awaiting determination.

## 2017-07-01 MED ORDER — FLUVOXAMINE MALEATE 25 MG PO TABS: 50.0000 mg | ORAL_TABLET | Freq: Every day | ORAL | 5 refills | Status: DC

## 2017-07-01 NOTE — Telephone Encounter (Signed)
Received denial from [place] that Desvenlafaxine Fumarate is not covered since its on the formulary list. I had put in the note previously that Citalopram and Duloxetine was ineffective. I am placing the denial in your box. Please advise.

## 2017-07-01 NOTE — Telephone Encounter (Signed)
Like you said, the denial states only one alternative has been tried, when we clearly documented that two have been tried. Yet again [employer] is stupid and wastes our time. I am just writing for something else on that formulary

## 2017-07-01 NOTE — Telephone Encounter (Signed)
Spoke with patients wifeRip Harbour) and she voices understanding about starting a new medication for her Spouse Troy Wilkerson). She is going to pick up the medication and she will let Evlyn Clines know about his progress.

## 2017-07-01 NOTE — Telephone Encounter (Signed)
Left message for patient to call back about note below.  

## 2017-07-08 ENCOUNTER — Telehealth: Payer: Self-pay

## 2017-07-08 ENCOUNTER — Other Ambulatory Visit: Payer: Self-pay | Admitting: Physician Assistant

## 2017-07-08 DIAGNOSIS — F321 Major depressive disorder, single episode, moderate: Secondary | ICD-10-CM

## 2017-07-08 DIAGNOSIS — F418 Other specified anxiety disorders: Secondary | ICD-10-CM

## 2017-07-08 MED ORDER — FLUVOXAMINE MALEATE 25 MG PO TABS: 50.0000 mg | ORAL_TABLET | Freq: Every day | ORAL | 1 refills | Status: DC

## 2017-07-08 NOTE — Telephone Encounter (Signed)
New Rx sent.

## 2017-07-08 NOTE — Telephone Encounter (Signed)
Left message advising of new prescription.  

## 2017-07-08 NOTE — Telephone Encounter (Signed)
[facility] called and states the presciption of fluvoxamine reads 2 tablets at bedtime. However, the quantity is 30. Please advise.

## 2017-07-10 NOTE — Telephone Encounter (Signed)
Patient advised.

## 2017-07-24 ENCOUNTER — Ambulatory Visit (INDEPENDENT_AMBULATORY_CARE_PROVIDER_SITE_OTHER): Payer: BLUE CROSS/BLUE SHIELD | Admitting: Sports Medicine

## 2017-07-24 ENCOUNTER — Ambulatory Visit: Payer: BLUE CROSS/BLUE SHIELD | Admitting: Sports Medicine

## 2017-07-24 ENCOUNTER — Encounter: Payer: Self-pay | Admitting: Sports Medicine

## 2017-07-24 ENCOUNTER — Ambulatory Visit (INDEPENDENT_AMBULATORY_CARE_PROVIDER_SITE_OTHER): Payer: BLUE CROSS/BLUE SHIELD

## 2017-07-24 DIAGNOSIS — M5416 Radiculopathy, lumbar region: Secondary | ICD-10-CM | POA: Diagnosis not present

## 2017-07-24 DIAGNOSIS — M545 Low back pain: Secondary | ICD-10-CM | POA: Diagnosis not present

## 2017-07-24 DIAGNOSIS — M25552 Pain in left hip: Secondary | ICD-10-CM | POA: Diagnosis not present

## 2017-07-24 DIAGNOSIS — M48061 Spinal stenosis, lumbar region without neurogenic claudication: Secondary | ICD-10-CM | POA: Insufficient documentation

## 2017-07-24 MED ORDER — PREDNISONE 50 MG PO TABS: ORAL_TABLET | ORAL | 0 refills | Status: DC

## 2017-07-24 MED ORDER — MELOXICAM 15 MG PO TABS: ORAL_TABLET | ORAL | 3 refills | Status: DC

## 2017-07-24 NOTE — Assessment & Plan Note (Signed)
Left lumbar radiculitis in an L3 distribution. I think he also has some hip joint pathology. X-rays of the lumbar spine, hip, formal physical therapy, meloxicam, prednisone.  Return to see me in 6 weeks.

## 2017-07-24 NOTE — Progress Notes (Signed)
Subjective:    I'm seeing this patient as a consultation for: Nelson Chimes, PA-C  CC: Low back pain  HPI: This is a pleasant 37 year old male, for the past several months he said increasing on and off pain in his low back, left-sided, worse with sitting, flexion, Valsalva with occasional radiation over the anterior aspect of his left thigh.  No overt paresthesias into the foot, no bowel or bladder dysfunction, saddle numbness, constitutional symptoms, trauma.  I reviewed the past medical history, family history, social history, surgical history, and allergies today and no changes were needed.  Please see the problem list section below in epic for further details.  Past Medical History: Past Medical History:  Diagnosis Date  . Anxiety   . Benign tumor of thoracic site    88 mos old  . Depression   . Obesity    Past Surgical History: Past Surgical History:  Procedure Laterality Date  . KNEE ARTHROSCOPY     left 2011  . THORACOTOMY     35 mos old   Social History: Social History   Socioeconomic History  . Marital status: Married    Spouse name: Not on file  . Number of children: Not on file  . Years of education: Not on file  . Highest education level: Not on file  Occupational History  . Not on file  Social Needs  . Financial resource strain: Not on file  . Food insecurity:    Worry: Not on file    Inability: Not on file  . Transportation needs:    Medical: Not on file    Non-medical: Not on file  Tobacco Use  . Smoking status: Never Smoker  . Smokeless tobacco: Never Used  Substance and Sexual Activity  . Alcohol use: No  . Drug use: No  . Sexual activity: Yes    Partners: Female    Birth control/protection: None  Lifestyle  . Physical activity:    Days per week: Not on file    Minutes per session: Not on file  . Stress: Not on file  Relationships  . Social connections:    Talks on phone: Not on file    Gets together: Not on file    Attends  religious service: Not on file    Active member of club or organization: Not on file    Attends meetings of clubs or organizations: Not on file    Relationship status: Not on file  Other Topics Concern  . Not on file  Social History Narrative  . Not on file   Family History: Family History  Problem Relation Age of Onset  . Cancer Mother   . Heart disease Mother   . Diabetes Mother   . Stroke Brother    Allergies: No Known Allergies Medications: See med rec.  Review of Systems: No headache, visual changes, nausea, vomiting, diarrhea, constipation, dizziness, abdominal pain, skin rash, fevers, chills, night sweats, weight loss, swollen lymph nodes, body aches, joint swelling, muscle aches, chest pain, shortness of breath, mood changes, visual or auditory hallucinations.   Objective:   General: Well Developed, well nourished, and in no acute distress.  Neuro:  Extra-ocular muscles intact, able to move all 4 extremities, sensation grossly intact.  Deep tendon reflexes tested were normal. Psych: Alert and oriented, mood congruent with affect. ENT:  Ears and nose appear unremarkable.  Hearing grossly normal. Neck: Unremarkable overall appearance, trachea midline.  No visible thyroid enlargement. Eyes: Conjunctivae and lids appear unremarkable.  Pupils equal and round. Skin: Warm and dry, no rashes noted.  Cardiovascular: Pulses palpable, no extremity edema. Back Exam:  Inspection: Unremarkable  Motion: Flexion 45 deg, Extension 45 deg, Side Bending to 45 deg bilaterally,  Rotation to 45 deg bilaterally  SLR laying: Negative  XSLR laying: Negative  Palpable tenderness: None. FABER: negative. Sensory change: Gross sensation intact to all lumbar and sacral dermatomes.  Reflexes: 2+ at both patellar tendons, 2+ at achilles tendons, Babinski's downgoing.  Strength at foot  Plantar-flexion: 5/5 Dorsi-flexion: 5/5 Eversion: 5/5 Inversion: 5/5  Leg strength  Quad: 5/5 Hamstring: 5/5  Hip flexor: 5/5 Hip abductors: 5/5  Gait unremarkable.  Impression and Recommendations:   This case required medical decision making of moderate complexity.  Left lumbar radiculitis Left lumbar radiculitis in an L3 distribution. I think he also has some hip joint pathology. X-rays of the lumbar spine, hip, formal physical therapy, meloxicam, prednisone.  Return to see me in 6 weeks. ___________________________________________ Gwen Her. Dianah Field, M.D., ABFM., CAQSM. Primary Care and [place] Instructor of [employer] [facility] [employer]

## 2017-07-26 ENCOUNTER — Encounter: Payer: Self-pay | Admitting: Physician Assistant

## 2017-07-26 ENCOUNTER — Ambulatory Visit (INDEPENDENT_AMBULATORY_CARE_PROVIDER_SITE_OTHER): Payer: BLUE CROSS/BLUE SHIELD | Admitting: Physician Assistant

## 2017-07-26 VITALS — BP 130/91 | HR 84 | Wt 257.0 lb

## 2017-07-26 DIAGNOSIS — R635 Abnormal weight gain: Secondary | ICD-10-CM

## 2017-07-26 DIAGNOSIS — F321 Major depressive disorder, single episode, moderate: Secondary | ICD-10-CM | POA: Diagnosis not present

## 2017-07-26 DIAGNOSIS — F418 Other specified anxiety disorders: Secondary | ICD-10-CM

## 2017-07-26 DIAGNOSIS — R1110 Vomiting, unspecified: Secondary | ICD-10-CM | POA: Insufficient documentation

## 2017-07-26 DIAGNOSIS — T50905A Adverse effect of unspecified drugs, medicaments and biological substances, initial encounter: Secondary | ICD-10-CM | POA: Diagnosis not present

## 2017-07-26 DIAGNOSIS — I10 Essential (primary) hypertension: Secondary | ICD-10-CM | POA: Diagnosis not present

## 2017-07-26 DIAGNOSIS — K219 Gastro-esophageal reflux disease without esophagitis: Secondary | ICD-10-CM

## 2017-07-26 MED ORDER — OMEPRAZOLE 40 MG PO CPDR: 40.0000 mg | DELAYED_RELEASE_CAPSULE | Freq: Every day | ORAL | 3 refills | Status: DC

## 2017-07-26 MED ORDER — FLUVOXAMINE MALEATE 100 MG PO TABS: 100.0000 mg | ORAL_TABLET | Freq: Every day | ORAL | 1 refills | Status: DC

## 2017-07-26 NOTE — Progress Notes (Signed)
HPI:                                                                Troy Wilkerson is a 37 y.o. male who presents to [place]: [place] today for medication management  Patient with depression, anxiety and cyclical vomiting presents for 1 month follow-up. He was started on Fluvox 25mg  and self-titrated to 50 mg. He is still taking Pamelor in the evenings. He has noticed a mild improvement. Still endorses depressed mood and anhedonia most days. Vomiting less frequently, at most twice a week  GERD: reports nocturnal regurgitation of acid most evenings and a feeling of tightness in his throat. Taking Omeprazole 20 mg in the evening. Never followed up with GI.  Depression screen [facility] 2/9 07/26/2017 06/28/2017 05/31/2017 04/11/2017 02/28/2017  Decreased Interest 2 3 1 2 3   Down, Depressed, Hopeless 2 2 1 1 2   PHQ - 2 Score 4 5 2 3 5   Altered sleeping 2 2 1 2 3   Tired, decreased energy 2 3 2 2 3   Change in appetite 1 2 2 1 2   Feeling bad or failure about yourself  1 2 1 1 2   Trouble concentrating 1 1 2 2 2   Moving slowly or fidgety/restless 0 1 1 1 1   Suicidal thoughts 0 1 0 0 1  PHQ-9 Score 11 17 11 12 19   Difficult doing work/chores Somewhat difficult - Somewhat difficult - -  Some recent data might be hidden    GAD 7 : Generalized Anxiety Score 07/26/2017 06/28/2017 05/31/2017 04/11/2017  Nervous, Anxious, on Edge 2 2 2 2   Control/stop worrying 2 2 2 2   Worry too much - different things 2 3 2 2   Trouble relaxing 2 2 2 2   Restless 1 1 1 1   Easily annoyed or irritable 1 0 1 1  Afraid - awful might happen 1 0 1 1  Total GAD 7 Score 11 10 11 11   Anxiety Difficulty Somewhat difficult - - -      Past Medical History:  Diagnosis Date  . Anxiety   . Benign tumor of thoracic site    73 mos old  . Depression   . Obesity    Past Surgical History:  Procedure Laterality Date  . KNEE ARTHROSCOPY     left 2011  . THORACOTOMY     3 mos old   Social History    Tobacco Use  . Smoking status: Never Smoker  . Smokeless tobacco: Never Used  Substance Use Topics  . Alcohol use: No   family history includes Cancer in his mother; Diabetes in his mother; Heart disease in his mother; Stroke in his brother.    ROS: negative except as noted in the HPI  Medications: Current Outpatient Medications  Medication Sig Dispense Refill  . fluticasone (FLONASE) 50 MCG/ACT nasal spray Place 1 spray into both nostrils daily. 16 g 1  . fluvoxaMINE (LUVOX) 100 MG tablet Take 1 tablet (100 mg total) by mouth at bedtime. 90 tablet 1  . meloxicam (MOBIC) 15 MG tablet One tab PO qAM with breakfast for 2 weeks, then daily prn pain. 30 tablet 3  . nortriptyline (PAMELOR) 50 MG capsule Take 1 capsule (50 mg total) by mouth  at bedtime. 30 capsule 5  . omeprazole (PRILOSEC) 40 MG capsule Take 1 capsule (40 mg total) by mouth daily. 30 capsule 3   No current facility-administered medications for this visit.    No Known Allergies     Objective:  BP (!) 130/91   Pulse 84   Wt 257 lb (116.6 kg)   BMI 34.86 kg/m  Gen:  alert, not ill-appearing, no distress, appropriate for age, obese male HEENT: head normocephalic without obvious abnormality, conjunctiva and cornea clear, wearing glasses, trachea midline Pulm: Normal work of breathing, normal phonation  Neuro: alert and oriented x 3, no tremor MSK: extremities atraumatic, normal gait and station Skin: intact, no rashes on exposed skin, no jaundice, no cyanosis Psych: well-groomed, cooperative, good eye contact, depressed mood, slightly flat affect mood-congruent, speech is articulate, and thought processes clear and goal-directed    No results found for this or any previous visit (from the past 72 hour(s)).     Assessment and Plan: 37 y.o. male with   1. Anxiety associated with depression - GAD7=11, unchanged - fluvoxaMINE (LUVOX) 100 MG tablet; Take 1 tablet (100 mg total) by mouth at bedtime.   Dispense: 90 tablet; Refill: 1  2. Depression, major, single episode, moderate (HCC) - PHQ9=11, improved from 17, no acute safety issues. Increasing from 50 to 100 mg QHS - fluvoxaMINE (LUVOX) 100 MG tablet; Take 1 tablet (100 mg total) by mouth at bedtime.  Dispense: 90 tablet; Refill: 1  3. Gastroesophageal reflux disease, esophagitis presence not specified - increased to 40 mg QHS. GERD diet. Recommended he contact [facility] to schedule his new patient appointment. They have left him 2 voicemails. - omeprazole (PRILOSEC) 40 MG capsule; Take 1 capsule (40 mg total) by mouth daily.  Dispense: 30 capsule; Refill: 3  4. Weight gain due to medication Wt Readings from Last 3 Encounters:  07/26/17 257 lb (116.6 kg)  07/24/17 254 lb (115.2 kg)  06/28/17 251 lb (113.9 kg)  - we are monitoring this. Once GI symptoms are under better control, I would like to add Wellbutrin or Metformin to assist with weight loss  5. HTN BP Readings from Last 3 Encounters:  07/26/17 (!) 130/91  07/24/17 140/82  06/28/17 124/85  - CVD risk factors: male sex, obesity - counseled on therapeutic lifestyle changes - will consider adding antihypertensive medication if BP not controlled with lifestyle alone in 3 months   Depression, major, single episode, moderate (HCC) - Plan: fluvoxaMINE (LUVOX) 100 MG tablet  Anxiety associated with depression - Plan: fluvoxaMINE (LUVOX) 100 MG tablet  Gastroesophageal reflux disease, esophagitis presence not specified - Plan: omeprazole (PRILOSEC) 40 MG capsule  Weight gain due to medication  Hypertension goal BP (blood pressure) < 130/80   Patient education and anticipatory guidance given Patient agrees with treatment plan Follow-up in 6 weeks or sooner as needed if symptoms worsen or fail to improve  Darlyne Russian PA-C

## 2017-07-26 NOTE — Patient Instructions (Addendum)
[facility] [phone]   Start Prednisone today and continue for 5 days Once completed, start the Meloxicam   Increase Omeprazole to 40 mg at bedtime    Counseling: - psychologytoday.com: search engine to locate local counselors - Family Services in your county offer counseling on a sliding scale (pay what you can afford) - [place]: we can place a referral for you to see one of licensed counselors in [place], [place], or [place] - online counseling: [place] and [facility] (not covered by insurance, but affordable self-pay rates)  Other resources: - https://www.[place].net/ - 7cupsoftea - ArmyDictionary.fi  Safety Plan: if having self-harm or suicidal thoughts Our Office [place] 1-800-SUICIDE If in immediate danger of harming yourself, go to the nearest emergency room or call 911

## 2017-09-04 ENCOUNTER — Encounter: Payer: Self-pay | Admitting: Sports Medicine

## 2017-09-04 ENCOUNTER — Ambulatory Visit (INDEPENDENT_AMBULATORY_CARE_PROVIDER_SITE_OTHER): Payer: BLUE CROSS/BLUE SHIELD | Admitting: Sports Medicine

## 2017-09-04 DIAGNOSIS — M5416 Radiculopathy, lumbar region: Secondary | ICD-10-CM | POA: Diagnosis not present

## 2017-09-04 NOTE — Progress Notes (Signed)
Subjective:    CC: Follow-up  HPI: This is a pleasant 37 year old male, I saw him about 6 weeks ago for axial low back pain with occasional left L3 radiculitis.  He did well with prednisone and meloxicam but never did physical therapy.  Does have a bit of persistent pain, and some discomfort at night.  No bowel or bladder dysfunction, saddle numbness, constitutional symptoms.  I reviewed the past medical history, family history, social history, surgical history, and allergies today and no changes were needed.  Please see the problem list section below in epic for further details.  Past Medical History: Past Medical History:  Diagnosis Date  . Anxiety   . Benign tumor of thoracic site    45 mos old  . Depression   . Obesity    Past Surgical History: Past Surgical History:  Procedure Laterality Date  . KNEE ARTHROSCOPY     left 2011  . THORACOTOMY     35 mos old   Social History: Social History   Socioeconomic History  . Marital status: Married    Spouse name: Not on file  . Number of children: Not on file  . Years of education: Not on file  . Highest education level: Not on file  Occupational History  . Not on file  Social Needs  . Financial resource strain: Not on file  . Food insecurity:    Worry: Not on file    Inability: Not on file  . Transportation needs:    Medical: Not on file    Non-medical: Not on file  Tobacco Use  . Smoking status: Never Smoker  . Smokeless tobacco: Never Used  Substance and Sexual Activity  . Alcohol use: No  . Drug use: No  . Sexual activity: Yes    Partners: Female    Birth control/protection: None  Lifestyle  . Physical activity:    Days per week: Not on file    Minutes per session: Not on file  . Stress: Not on file  Relationships  . Social connections:    Talks on phone: Not on file    Gets together: Not on file    Attends religious service: Not on file    Active member of club or organization: Not on file    Attends  meetings of clubs or organizations: Not on file    Relationship status: Not on file  Other Topics Concern  . Not on file  Social History Narrative  . Not on file   Family History: Family History  Problem Relation Age of Onset  . Cancer Mother   . Heart disease Mother   . Diabetes Mother   . Stroke Brother    Allergies: No Known Allergies Medications: See med rec.  Review of Systems: No fevers, chills, night sweats, weight loss, chest pain, or shortness of breath.   Objective:    General: Well Developed, well nourished, and in no acute distress.  Neuro: Alert and oriented x3, extra-ocular muscles intact, sensation grossly intact.  HEENT: Normocephalic, atraumatic, pupils equal round reactive to light, neck supple, no masses, no lymphadenopathy, thyroid nonpalpable.  Skin: Warm and dry, no rashes. Cardiac: Regular rate and rhythm, no murmurs rubs or gallops, no lower extremity edema.  Respiratory: Clear to auscultation bilaterally. Not using accessory muscles, speaking in full sentences.   Impression and Recommendations:    Left lumbar radiculitis Left L3 distribution radiculitis. There likely was some hip joint pathology as well. 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 He is going to go ahead and make an appointment with physical therapy and I will see him back in 6 weeks, MR for interventional planning if no better.  I spent 25 minutes with this patient, greater than 50% was face-to-face time counseling regarding the above diagnoses ___________________________________________ Gwen Her. Dianah Field, M.D., ABFM., CAQSM. Primary Care and [place] Instructor of [facility]

## 2017-09-04 NOTE — Assessment & Plan Note (Signed)
Left L3 distribution radiculitis. There likely was some hip joint pathology as well. He does have some mild multilevel degenerative disc disease in the lumbar spine, never did physical therapy, did have good response to prednisone and meloxicam. He is going to go ahead and make an appointment with physical therapy and I will see him back in 6 weeks, MR for interventional planning if no better.

## 2017-09-11 ENCOUNTER — Ambulatory Visit: Payer: BLUE CROSS/BLUE SHIELD | Admitting: Physical Therapy

## 2017-09-13 ENCOUNTER — Ambulatory Visit: Payer: BLUE CROSS/BLUE SHIELD | Admitting: Physician Assistant

## 2017-09-13 ENCOUNTER — Encounter: Payer: Self-pay | Admitting: Physician Assistant

## 2017-09-13 VITALS — BP 125/81 | HR 80 | Ht 72.01 in | Wt 253.0 lb

## 2017-09-13 DIAGNOSIS — F418 Other specified anxiety disorders: Secondary | ICD-10-CM | POA: Diagnosis not present

## 2017-09-13 DIAGNOSIS — J3089 Other allergic rhinitis: Secondary | ICD-10-CM | POA: Diagnosis not present

## 2017-09-13 DIAGNOSIS — Z7183 Encounter for nonprocreative genetic counseling: Secondary | ICD-10-CM

## 2017-09-13 DIAGNOSIS — Z1371 Encounter for nonprocreative screening for genetic disease carrier status: Secondary | ICD-10-CM | POA: Diagnosis not present

## 2017-09-13 DIAGNOSIS — F331 Major depressive disorder, recurrent, moderate: Secondary | ICD-10-CM | POA: Diagnosis not present

## 2017-09-13 MED ORDER — FEXOFENADINE-PSEUDOEPHED ER 180-240 MG PO TB24: 1.0000 | ORAL_TABLET | Freq: Every day | ORAL | Status: DC

## 2017-09-13 MED ORDER — FLUVOXAMINE MALEATE 100 MG PO TABS: 50.0000 mg | ORAL_TABLET | Freq: Every day | ORAL | 1 refills | Status: DC

## 2017-09-13 NOTE — Patient Instructions (Addendum)
Plan - genetic test to guide medication management - referral placed to cognitive behavioral therapy - alprazolam as needed for severe anxiety/vomiting - cut Fluvoxamine tablets in half, take 50 mg at bedtime until we have test results  Counseling: - psychologytoday.com: search engine to locate local counselors - Family Services in your county offer counseling on a sliding scale (pay what you can afford) - [place]: we can place a referral for you to see one of licensed counselors in [place], [place], or [place] - online counseling: [place] and [facility] (not covered by insurance, but affordable self-pay rates)   Safety Plan: if having self-harm or suicidal thoughts Our Office [place] 1-800-SUICIDE If in immediate danger of harming yourself, go to the nearest emergency room or call 911     Cognitive Behavioral Therapy Cognitive behavioral therapy (CBT) is a short-term, goal-oriented type of talk therapy. CBT can help you:  Identify patterns of thinking, feeling, and behaving that are causing you problems.  Decide how you want to think, feel, and respond to life events.  Set goals to change the beliefs and thoughts that cause you to act in ways that are not helpful for you.  Follow up on the changes that you make.  What are the different types of CBT? The different types of CBT include:  Dialectical behavioral therapy (DBT). This approach is often used in group therapy, and it aids a person in managing behavior by focusing on: ? Things that cause problems to start (triggers). ? Methods of self-calming. ? Re-evaluating thinking processes.  Mindfulness-based cognitive therapy. This approach involves focusing your attention, meditating, and developing awareness of the present moment (mindfulness).  Rational emotive behavior therapy. This approach uses rational thought to  reframe your thinking so it is less judgmental. Your therapist may directly challenge your thought processes.  Stress inoculation training. This approach involves planning ahead for stressful situations by practicing new thoughts and behaviors. This planning can help you avoid going back to old actions.  Acceptance and commitment therapy (ACT). This approach focuses on accepting yourself as you are and practicing mindfulness. It helps you understand what you would like to change and how you can set goals in that direction.  What conditions is CBT used to treat? CBT may help to treat:  Mental health conditions, including: ? Depression. ? Anxiety. ? Bipolar disorder. ? Eating disorders. ? Post-traumatic stress disorder (PTSD). ? Obsessive-compulsive disorder (OCD).  Insomnia and other sleep disorders.  Pain.  Stress.  Coping with loss or grief.  Coping with a difficult medical diagnosis or illness.  Relationship problems.  Emotional distress or shock (trauma).  How can CBT help me? CBT may:  Give you a chance to share your thoughts, feelings, problems, and fears in a safe space.  Help you focus on specific problems.  Give you homework that helps you put theory into practice. Homework may include keeping a journal or doing thinking exercises.  Help you become aware of your patterns of thinking, feeling, and behaving, and how those three patterns affect each other.  Change your thoughts so that you can change your behaviors.  Help you chose how you want to view the world.  Teach you planned coping skills and offer better ways to deal with stress and difficult situations.  To make the most of CBT, make sure you:  Find a licensed therapist whom you trust.  Take an active part in your therapy and do the  homework that you are given.  Are honest about your problems.  Avoid skipping your therapy sessions.  Summary  Cognitive behavioral therapy (CBT) is a short-term,  goal-oriented type of talk therapy.  CBT can help you become aware of your patterns and the relationships among your thoughts, feelings, and behavior.  CBT may help mental health conditions and other problems. This information is not intended to replace advice given to you by your health care provider. Make sure you discuss any questions you have with your health care provider. Document Released: 08/21/2016 Document Revised: 08/21/2016 Document Reviewed: 08/21/2016 Elsevier Interactive Patient Education  2018 [employer].

## 2017-09-13 NOTE — Progress Notes (Signed)
HPI:                                                                Troy Wilkerson is a 37 y.o. male who presents to [place]: [place] today for depression/anxiety follow-up  Patient with depression, anxiety and cyclical vomiting presents for 1 month follow-up. He was increased from 50mg  to 100 mg of Fluvox 1 month ago.  He is still taking Pamelor 50 mg in the evenings. Reports he feels unchanged from last visit and is vomiting more. 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 Still endorses depressed mood and anhedonia most days. Endorses passive SI, no plan or self-harming behaviors.  Also reports nasal congestion and sinus pressure. Using Flonase daily.  Depression screen [facility] 2/9 09/13/2017 07/26/2017 06/28/2017 05/31/2017 04/11/2017  Decreased Interest 2 2 3 1 2   Down, Depressed, Hopeless 2 2 2 1 1   PHQ - 2 Score 4 4 5 2 3   Altered sleeping 2 2 2 1 2   Tired, decreased energy 2 2 3 2 2   Change in appetite 1 1 2 2 1   Feeling bad or failure about yourself  1 1 2 1 1   Trouble concentrating 2 1 1 2 2   Moving slowly or fidgety/restless 2 0 1 1 1   Suicidal thoughts 1 0 1 0 0  PHQ-9 Score 15 11 17 11 12   Difficult doing work/chores Somewhat difficult Somewhat difficult - Somewhat difficult -  Some recent data might be hidden    GAD 7 : Generalized Anxiety Score 09/13/2017 07/26/2017 06/28/2017 05/31/2017  Nervous, Anxious, on Edge 2 2 2 2   Control/stop worrying 3 2 2 2   Worry too much - different things 2 2 3 2   Trouble relaxing 3 2 2 2   Restless 2 1 1 1   Easily annoyed or irritable 2 1 0 1  Afraid - awful might happen 1 1 0 1  Total GAD 7 Score 15 11 10 11   Anxiety Difficulty Somewhat difficult Somewhat difficult - -      Past Medical History:  Diagnosis Date  . Anxiety   . Benign tumor of thoracic site    59 mos old  . Depression   . Obesity    Past Surgical History:  Procedure Laterality Date   . KNEE ARTHROSCOPY     left 2011  . THORACOTOMY     103 mos old   Social History   Tobacco Use  . Smoking status: Never Smoker  . Smokeless tobacco: Never Used  Substance Use Topics  . Alcohol use: No   family history includes Cancer in his mother; Diabetes in his mother; Heart disease in his mother; Stroke in his brother.    ROS: negative except as noted in the HPI  Medications: Current Outpatient Medications  Medication Sig Dispense Refill  . fluticasone (FLONASE) 50 MCG/ACT nasal spray Place 1 spray into both nostrils daily. 16 g 1  . fluvoxaMINE (LUVOX) 100 MG tablet Take 0.5 tablets (50 mg total) by mouth at bedtime. 90 tablet 1  . meloxicam (MOBIC) 15 MG tablet One tab PO qAM with breakfast for 2 weeks, then daily prn pain. 30 tablet 3  . nortriptyline (PAMELOR) 50 MG  capsule Take 1 capsule (50 mg total) by mouth at bedtime. 30 capsule 5  . omeprazole (PRILOSEC) 40 MG capsule Take 1 capsule (40 mg total) by mouth daily. 30 capsule 3  . fexofenadine-pseudoephedrine (ALLEGRA-D ALLERGY & CONGESTION) 180-240 MG 24 hr tablet Take 1 tablet by mouth daily.     No current facility-administered medications for this visit.    No Known Allergies     Objective:  BP 125/81   Pulse 80   Ht 6' 0.01" (1.829 m)   Wt 253 lb (114.8 kg)   BMI 34.31 kg/m  Gen:  alert, not ill-appearing, no distress, appropriate for age 50: head normocephalic without obvious abnormality, conjunctiva and cornea clear, wearing glasses, trachea midline Pulm: Normal work of breathing, normal phonation Neuro: alert and oriented x 3, no tremor MSK: extremities atraumatic, normal gait and station Skin: intact, no rashes on exposed skin, no jaundice, no cyanosis Psych: well-groomed, cooperative, good eye contact, depressed mood, affect is not mood-congruent, speech is articulate, and thought processes clear and goal-directed    No results found for this or any previous visit (from the past 72  hour(s)). No results found.    Assessment and Plan: 37 y.o. male with   Moderate episode of recurrent major depressive disorder ([place]) - Plan: Ambulatory referral to Psychiatry, fluvoxaMINE (LUVOX) 100 MG tablet  Anxiety associated with depression - Plan: fluvoxaMINE (LUVOX) 100 MG tablet  Chronic non-seasonal allergic rhinitis - Plan: fexofenadine-pseudoephedrine (ALLEGRA-D ALLERGY & CONGESTION) 180-240 MG 24 hr tablet  Encounter for nonprocreative genetic counseling and testing - [facility] psychotropic test ordered 09/13/17  -PHQ9=15, moderate, worsened from 11, 4 weeks ago. GAD7-15, also worsened from 11, 4 weeks ago - At this point patient has failed multiple antidepressants - Lexapro, Cymbalta, Luvox, Elavil and Pamelor. Recommend we proceed with genetic testing for psychotropic medications. Patient amenable to this. Buccal swab collected in office today and sent to [facility] - Work/financial stress seems to be a main driver of his anxiety and vomiting. I think he would respond well to CBT. He is amenable to this. Referral placed to [facility] for counseling. - we will start tapering him down on Luvox to 50 mg nightly pending results of genetic testing - he declined benzodiazepines    Patient education and anticipatory guidance given Patient agrees with treatment plan Follow-up in 4-6 weeks or sooner as needed if symptoms worsen or fail to improve  Darlyne Russian PA-C

## 2017-09-18 ENCOUNTER — Encounter: Payer: Self-pay | Admitting: Rehabilitative and Restorative Service Providers"

## 2017-09-18 ENCOUNTER — Ambulatory Visit: Payer: BLUE CROSS/BLUE SHIELD | Admitting: Rehabilitative and Restorative Service Providers"

## 2017-09-18 DIAGNOSIS — Z7409 Other reduced mobility: Secondary | ICD-10-CM

## 2017-09-18 DIAGNOSIS — M5442 Lumbago with sciatica, left side: Secondary | ICD-10-CM

## 2017-09-18 DIAGNOSIS — M2560 Stiffness of unspecified joint, not elsewhere classified: Secondary | ICD-10-CM

## 2017-09-18 DIAGNOSIS — R293 Abnormal posture: Secondary | ICD-10-CM

## 2017-09-18 DIAGNOSIS — R29898 Other symptoms and signs involving the musculoskeletal system: Secondary | ICD-10-CM

## 2017-09-18 NOTE — Therapy (Signed)
[place], [place], [address] Phone: [phone]   Fax:  [phone]  Physical Therapy Evaluation  Patient Details  Name: Troy Wilkerson MRN: [id number] Date of Birth: October 31, 1980 Referring Provider: Dr Dianah Field   Encounter Date: 09/18/2017  PT End of Session - 09/18/17 1136    Visit Number  1    Number of Visits  12    Date for PT Re-Evaluation  10/23/17    PT Start Time  0905    PT Stop Time  1008    PT Time Calculation (min)  63 min    Activity Tolerance  Patient tolerated treatment well       Past Medical History:  Diagnosis Date  . Anxiety   . Benign tumor of thoracic site    66 mos old  . Depression   . Obesity     Past Surgical History:  Procedure Laterality Date  . KNEE ARTHROSCOPY     left 2011  . THORACOTOMY     6 mos old    There were no vitals filed for this visit.   Subjective Assessment - 09/18/17 0816    Subjective  Troy Wilkerson reports that he started having pain in his Lt LB and into the Lt LE over the past month or more. He awoke with LBP which has continued over the past several weeks. He was seen by MD 07/24/17 and treated with medication with good improvement. He has improved but some pain persists. Patient has a history of LBP over the past 2-3 years ever 2 months or so. He experienced an injury to LB sledding in his late teens landing on his buttocks with subsequent LBP which resolved without treatment.     Pertinent History  Bilateral knee pain Lt > Rt; arthroscopic surgery Lt knee ~ 10 yrs ago; depression     How long can you sit comfortably?  30 min     How long can you stand comfortably?  5 min     How long can you walk comfortably?  10 min     Diagnostic tests  xray    Patient Stated Goals  get rid of LBP and keep it from coming back          [facility] PT Assessment - 09/18/17 0001      Assessment   Medical Diagnosis  Lt lumbar radiculitis     Referring Provider   Dr Dianah Field    Onset Date/Surgical Date  07/10/17    Hand Dominance  Right    Next MD Visit  6/19    Prior Therapy  none       Precautions   Precautions  None      Balance Screen   Has the patient fallen in the past 6 months  No    Has the patient had a decrease in activity level because of a fear of falling?   No    Is the patient reluctant to leave their home because of a fear of falling?   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; climbing in and out of vechicle; lifting tools from less than 1 pound up to 800 pounds (on wheels) - just over a year     Leisure  yard work; playing with kids; tractor pulling - pushing and pulling weights for the  tractor pull       Observation/Other Assessments   Focus on Therapeutic Outcomes (FOTO)   44% limitation       Sensation   Additional Comments  WFL's per pt report       Posture/Postural Control   Posture Comments  head forward; shoulders rounded; decreaed lumbar lordosis; sits in rounded posture       AROM   Overall AROM Comments  end range tightness bilat hips     AROM Assessment Site  -- lumbar ROM assessed in standing     Lumbar Flexion  50%    Lumbar Extension  50%    Lumbar - Right Side Bend  75% pulling Lt LB    Lumbar - Left Side Bend  75% discomfort Lt LB     Lumbar - Right Rotation  45%    Lumbar - Left Rotation  45%      Strength   Overall Strength Comments  WFL's bilat LE's       Flexibility   Hamstrings  tight bilat ~ 45-50 deg    Quadriceps  tight bilat    ITB  tight bilat     Piriformis  tight bilat Lt > Rt       Palpation   Spinal mobility  hypomobility with CPA mobs through lumbar spine     Palpation comment  muscular tightness through Lt lumbar paraspinals; QL into the Lt piriformis and hip abductors       Special Tests   Other special tests  (-) SLR; [place]       Transfers   Comments  poor body mechanics  with all transfers and transitiional movements                 Objective measurements completed on examination: See above findings.      [facility] Adult PT Treatment/Exercise - 09/18/17 0001      Lumbar Exercises: Stretches   Passive Hamstring Stretch  Left;Right;2 reps;30 seconds supine with strap     Standing Extension  2 reps 2-3 sec pause     Press Ups  -- 2 sec hold x 10 sec       Lumbar Exercises: Supine   Other Supine Lumbar Exercises  3 part core 10 sec x 10       Moist Heat Therapy   Number Minutes Moist Heat  20 Minutes    Moist Heat Location  Lumbar Spine      Electrical Stimulation   Electrical Stimulation Location  bilat lumbar to Lt posterior hip/crest of pelvis area     Electrical Stimulation Action  IFC    Electrical Stimulation Parameters  to tolerance    Electrical Stimulation Goals  Pain;Tone             PT Education - 09/18/17 1018    Education provided  Yes    Education Details  HEP; initiated back care and proper body mechanics instruction; TENs     Person(s) Educated  Patient    Methods  Explanation;Demonstration;Tactile cues;Verbal cues;Handout    Comprehension  Verbalized understanding;Returned demonstration;Verbal cues required;Tactile cues required          PT Long Term Goals - 09/18/17 1141      PT LONG TERM GOAL #1   Title  Improve trunk moblity with patient to demonstrate >/= 75% lumbar ROM in standing 10/29/17    Time  6    Period  Weeks    Status  New  PT LONG TERM GOAL #2   Title  Patient will demonstrate proper body mechanics with transfers and transitional movements and demo/verbalize understanding of correct back care techniques 10/29/17    Time  6    Period  Weeks    Status  New      PT LONG TERM GOAL #3   Title  Patient reports 75-90% decrease in LBP and Lt LE pain 10/29/17    Time  6    Period  Weeks    Status  New      PT LONG TERM GOAL #4   Title  Independent in HEP 10/29/17    Time  6    Period  Weeks     Status  New      PT LONG TERM GOAL #5   Title  mprove FOTO to </= 29% limitation 10/29/17    Time  6    Period  Weeks    Status  New             Plan - 09/18/17 1136    Clinical Impression Statement  Troy Wilkerson presents with recurrent LBP with Lt LE radicular symptoms with current episode of pain over the past 2 months. Symptoms have improved some with medication but patient continues to have on a daily basis. Patient has poor posture and alignment; poor body mechanics with transfers and transitonal movement; limited trunk and LE mobility/ROM; muscular tightness Lt lumbar/hip musculature; hypomobility through the lumbar spine with CPA mobs; pain. He will benefit from skilled Physical Therapy to address problems identified.     History and Personal Factors relevant to plan of care:  recurrent pain over the past 2 years     Clinical Presentation  Evolving    Clinical Decision Making  Low    Rehab Potential  Good    PT Frequency  2x / week    PT Duration  6 weeks    PT Treatment/Interventions  Patient/family education;ADLs/Self Care Home Management;Cryotherapy;Electrical Stimulation;Iontophoresis 4mg /ml Dexamethasone;Moist Heat;Ultrasound;Dry needling;Manual techniques;Neuromuscular re-education;Therapeutic activities;Therapeutic exercise    PT Next Visit Plan  review HEP; add piriformis stretch; myofacial ball release work Lt hip and LB; core stabilization; manual work v DN for Lt posterior hip musculature; modalities; back education     Consulted and Agree with Plan of Care  Patient       Patient will benefit from skilled therapeutic intervention in order to improve the following deficits and impairments:  Postural dysfunction, Improper body mechanics, Pain, Increased muscle spasms, Increased fascial restricitons, Decreased range of motion, Decreased mobility  Visit Diagnosis: Acute bilateral low back pain with left-sided sciatica - Plan: PT plan of care cert/re-cert  Stiffness due to  immobility - Plan: PT plan of care cert/re-cert  Abnormal posture - Plan: PT plan of care cert/re-cert  Other symptoms and signs involving the musculoskeletal system - Plan: PT plan of care cert/re-cert     Problem List Patient Active Problem List   Diagnosis Date Noted  . Encounter for nonprocreative genetic counseling and testing 09/13/2017  . Regurgitation of food 07/26/2017  . Hypertension goal BP (blood pressure) < 130/80 07/26/2017  . Left lumbar radiculitis 07/24/2017  . Encounter for medication management 06/28/2017  . Habitual snoring 06/28/2017  . Long uvula 06/28/2017  . Excessive daytime sleepiness 06/28/2017  . Weight gain due to medication 05/31/2017  . Class 1 obesity due to excess calories without serious comorbidity in adult 05/31/2017  . Chronic non-seasonal allergic rhinitis 04/22/2017  . Nonintractable episodic headache  04/17/2017  . Epigastric pain 04/11/2017  . Non-intractable cyclical vomiting with nausea 02/28/2017  . Dyspepsia 02/28/2017  . Nausea 02/07/2017  . Paronychia of finger of right hand 02/07/2017  . Anxiety associated with depression 01/16/2017  . Bone lesion 01/10/2017  . Low libido 01/10/2017  . Tennis elbow 12/27/2016  . Knee pain, bilateral 12/27/2016  . Depression, major, single episode, moderate ([place]) 12/27/2016    Celyn Nilda Simmer PT, MPH  09/18/2017, 11:48 AM  [facility] [place], [place], [address] Phone: [phone]   Fax:  [phone]  Name: Troy Wilkerson MRN: [id number] Date of Birth: 02/27/1981

## 2017-09-18 NOTE — Patient Instructions (Signed)
Trunk: Prone Extension (Press-Ups)    Lie on stomach on firm, flat surface. Relax bottom and legs. Raise chest in air with elbows straight. Keep hips flat on surface, sag stomach. Hold _2-3___ seconds. Repeat __10__ times. Do _2-3___ sessions per day. CAUTION: Movement should be gentle and slow.   Trunk Extension    Standing, place back of open hands on low back. Straighten spine then arch the back and move shoulders back. Repeat __2-3__ times per session. Do _several times per day  HIP: Hamstrings - Supine  Place strap around foot. Raise leg up, keeping knee straight.  Bend opposite knee to protect back if indicated. Hold 30 seconds. 3 reps per set, 2-3 sets per day  Abdominal Bracing With Pelvic Floor (Hook-Lying)    With neutral spine, tighten pelvic floor and abdominals sucking belly button to back bone; tighten muscles at waist. Hold 10 sec  Repeat _10__ times. Do _several __ times a day.      Sleeping on Back  Place pillow under knees. A pillow with cervical support and a roll around waist are also helpful. Copyright  VHI. All rights reserved.  Sleeping on Side Place pillow between knees. Use cervical support under neck and a roll around waist as needed. Copyright  VHI. All rights reserved.   Sleeping on Stomach   If this is the only desirable sleeping position, place pillow under lower legs, and under stomach or chest as needed.  Posture - Sitting   Sit upright, head facing forward. Try using a roll to support lower back. Keep shoulders relaxed, and avoid rounded back. Keep hips level with knees. Avoid crossing legs for long periods. Stand to Sit / Sit to Stand   To sit: Bend knees to lower self onto front edge of chair, then scoot back on seat. To stand: Reverse sequence by placing one foot forward, and scoot to front of seat. Use rocking motion to stand up.   Work Height and Reach  Ideal work height is no more than 2 to 4 inches below elbow level when  standing, and at elbow level when sitting. Reaching should be limited to arm's length, with elbows slightly bent.  Bending  Bend at hips and knees, not back. Keep feet shoulder-width apart.    Posture - Standing   Good posture is important. Avoid slouching and forward head thrust. Maintain curve in low back and align ears over shoul- ders, hips over ankles.  Alternating Positions   Alternate tasks and change positions frequently to reduce fatigue and muscle tension. Take rest breaks. Computer Work   Position work to Programmer, multimedia. Use proper work and seat height. Keep shoulders back and down, wrists straight, and elbows at right angles. Use chair that provides full back support. Add footrest and lumbar roll as needed.  Getting Into / Out of Car  Lower self onto seat, scoot back, then bring in one leg at a time. Reverse sequence to get out.  Dressing  Lie on back to pull socks or slacks over feet, or sit and bend leg while keeping back straight.    Housework - Sink  Place one foot on ledge of cabinet under sink when standing at sink for prolonged periods.   Pushing / Pulling  Pushing is preferable to pulling. Keep back in proper alignment, and use leg muscles to do the work.  Deep Squat   Squat and lift with both arms held against upper trunk. Tighten stomach muscles without holding breath. Use smooth movements  to avoid jerking.  Avoid Twisting   Avoid twisting or bending back. Pivot around using foot movements, and bend at knees if needed when reaching for articles.  Carrying Luggage   Distribute weight evenly on both sides. Use a cart whenever possible. Do not twist trunk. Move body as a unit.   Lifting Principles .Maintain proper posture and head alignment. .Slide object as close as possible before lifting. .Move obstacles out of the way. .Test before lifting; ask for help if too heavy. .Tighten stomach muscles without holding breath. .Use smooth  movements; do not jerk. .Use legs to do the work, and pivot with feet. .Distribute the work load symmetrically and close to the center of trunk. .Push instead of pull whenever possible.   Ask For Help   Ask for help and delegate to others when possible. Coordinate your movements when lifting together, and maintain the low back curve.  Log Roll   Lying on back, bend left knee and place left arm across chest. Roll all in one movement to the right. Reverse to roll to the left. Always move as one unit. Housework - Sweeping  Use long-handled equipment to avoid stooping.   Housework - Wiping  Position yourself as close as possible to reach work surface. Avoid straining your back.  Laundry - Unloading Wash   To unload small items at bottom of washer, lift leg opposite to arm being used to reach.  [place] close to area to be raked. Use arm movements to do the work. Keep back straight and avoid twisting.     Cart  When reaching into cart with one arm, lift opposite leg to keep back straight.   Getting Into / Out of Bed  Lower self to lie down on one side by raising legs and lowering head at the same time. Use arms to assist moving without twisting. Bend both knees to roll onto back if desired. To sit up, start from lying on side, and use same move-ments in reverse. Housework - Vacuuming  Hold the vacuum with arm held at side. Step back and forth to move it, keeping head up. Avoid twisting.   Laundry - IT consultant so that bending and twisting can be avoided.   Laundry - Unloading Dryer  Squat down to reach into clothes dryer or use a reacher.  Gardening - Weeding / Probation officer or Kneel. Knee pads may be helpful.                   TENS UNIT: This is helpful for muscle pain and spasm.   Search and Purchase a TENS 7000 2nd edition at www.tenspros.com. It should be less than $30.     TENS unit  instructions: Do not shower or bathe with the unit on Turn the unit off before removing electrodes or batteries If the electrodes lose stickiness add a drop of water to the electrodes after they are disconnected from the unit and place on plastic sheet. If you continued to have difficulty, call the TENS unit company to purchase more electrodes. Do not apply lotion on the skin area prior to use. Make sure the skin is clean and dry as this will help prolong the life of the electrodes. After use, always check skin for unusual red areas, rash or other skin difficulties. If there are any skin problems, does not apply electrodes to the same area. Never remove the electrodes from the unit by pulling  the wires. Do not use the TENS unit or electrodes other than as directed. Do not change electrode placement without consultating your therapist or physician. Keep 2 fingers with between each electrode.

## 2017-09-23 ENCOUNTER — Telehealth: Payer: Self-pay | Admitting: Physician Assistant

## 2017-09-23 ENCOUNTER — Ambulatory Visit: Payer: BLUE CROSS/BLUE SHIELD | Admitting: Rehabilitative and Restorative Service Providers"

## 2017-09-23 ENCOUNTER — Encounter: Payer: Self-pay | Admitting: Rehabilitative and Restorative Service Providers"

## 2017-09-23 DIAGNOSIS — F418 Other specified anxiety disorders: Secondary | ICD-10-CM

## 2017-09-23 DIAGNOSIS — R293 Abnormal posture: Secondary | ICD-10-CM

## 2017-09-23 DIAGNOSIS — R29898 Other symptoms and signs involving the musculoskeletal system: Secondary | ICD-10-CM | POA: Diagnosis not present

## 2017-09-23 DIAGNOSIS — R1115 Cyclical vomiting syndrome unrelated to migraine: Secondary | ICD-10-CM

## 2017-09-23 DIAGNOSIS — M5442 Lumbago with sciatica, left side: Secondary | ICD-10-CM | POA: Diagnosis not present

## 2017-09-23 DIAGNOSIS — J019 Acute sinusitis, unspecified: Principal | ICD-10-CM

## 2017-09-23 DIAGNOSIS — Z7409 Other reduced mobility: Secondary | ICD-10-CM

## 2017-09-23 DIAGNOSIS — M2560 Stiffness of unspecified joint, not elsewhere classified: Secondary | ICD-10-CM | POA: Diagnosis not present

## 2017-09-23 DIAGNOSIS — B9689 Other specified bacterial agents as the cause of diseases classified elsewhere: Secondary | ICD-10-CM

## 2017-09-23 MED ORDER — FLUVOXAMINE MALEATE 25 MG PO TABS: 25.0000 mg | ORAL_TABLET | Freq: Every day | ORAL | 0 refills | Status: DC

## 2017-09-23 MED ORDER — ONDANSETRON 8 MG PO TBDP: 8.0000 mg | ORAL_TABLET | Freq: Three times a day (TID) | ORAL | 3 refills | Status: DC | PRN

## 2017-09-23 MED ORDER — PROMETHAZINE HCL 25 MG PO TABS: 25.0000 mg | ORAL_TABLET | Freq: Every evening | ORAL | 2 refills | Status: DC | PRN

## 2017-09-23 MED ORDER — AMOXICILLIN-POT CLAVULANATE 875-125 MG PO TABS: 1.0000 | ORAL_TABLET | Freq: Two times a day (BID) | ORAL | 0 refills | Status: AC

## 2017-09-23 NOTE — Telephone Encounter (Signed)
Called and spoke to patient regarding GeneSight psychotropic test results Recommended patient research Pristiq, Viibryd and Trinellix in terms of cost/coverage and adverse effects and give [place] a call back with his decision Tapered his Luvox further to 25 mg for 1 week, then discontinue  Patient reports he has felt "rotten" all day. Having copious nasal secretions and coughing up purulent drainage. Has body aches and nausea and vomiting. Has been doing Allegra-D, Flonase and netti pot without much relief. I will treat him for bacterial sinusitis with Augmentin. Instructed to take with food I have also refilled his prn nausea medications for cyclic vomiting syndrome

## 2017-09-23 NOTE — Patient Instructions (Addendum)
Self massage using ~ 4 inch plastic ball    Piriformis Stretch   Lying on back, pull right knee toward opposite shoulder. Hold 30 seconds. Repeat 3 times. Do 2-3 sessions per day.  Trigger Point Dry Needling  . What is Trigger Point Dry Needling (DN)? o DN is a physical therapy technique used to treat muscle pain and dysfunction. Specifically, DN helps deactivate muscle trigger points (muscle knots).  o A thin filiform needle is used to penetrate the skin and stimulate the underlying trigger point. The goal is for a local twitch response (LTR) to occur and for the trigger point to relax. No medication of any kind is injected during the procedure.   . What Does Trigger Point Dry Needling Feel Like?  o The procedure feels different for each individual patient. Some patients report that they do not actually feel the needle enter the skin and overall the process is not painful. Very mild bleeding may occur. However, many patients feel a deep cramping in the muscle in which the needle was inserted. This is the local twitch response.   Marland Kitchen How Will I feel after the treatment? o Soreness is normal, and the onset of soreness may not occur for a few hours. Typically this soreness does not last longer than two days.  o Bruising is uncommon, however; ice can be used to decrease any possible bruising.  o In rare cases feeling tired or nauseous after the treatment is normal. In addition, your symptoms may get worse before they get better, this period will typically not last longer than 24 hours.   . What Can I do After My Treatment? o Increase your hydration by drinking more water for the next 24 hours. o You may place ice or heat on the areas treated that have become sore, however, do not use heat on inflamed or bruised areas. Heat often brings more relief post needling. o You can continue your regular activities, but vigorous activity is not recommended initially after the treatment for 24 hours. o DN  is best combined with other physical therapy such as strengthening, stretching, and other therapies.

## 2017-09-23 NOTE — Therapy (Signed)
[facility] [place], [place], [address] Phone: [phone]   Fax:  [phone]  Physical Therapy Treatment  Patient Details  Name: Troy Wilkerson MRN: [id number] Date of Birth: 02/03/1981 Referring Provider: Dr Dianah Field   Encounter Date: 09/23/2017  PT End of Session - 09/23/17 0808    Visit Number  2    Number of Visits  12    Date for PT Re-Evaluation  10/23/17    PT Start Time  0805    PT Stop Time  0902    PT Time Calculation (min)  57 min    Activity Tolerance  Patient tolerated treatment well       Past Medical History:  Diagnosis Date  . Anxiety   . Benign tumor of thoracic site    66 mos old  . Depression   . Obesity     Past Surgical History:  Procedure Laterality Date  . KNEE ARTHROSCOPY     left 2011  . THORACOTOMY     6 mos old    There were no vitals filed for this visit.  Subjective Assessment - 09/23/17 0808    Subjective  Working on his exercises at home. Has some tightness and discomfort in the Lt LB this morning. Wonders if his sleeping position has some influence on symptoms.     Currently in Pain?  Yes    Pain Score  5     Pain Location  Back    Pain Orientation  Left    Pain Descriptors / Indicators  Dull sharp with wrong movement     Pain Type  Chronic pain;Acute pain    Pain Onset  More than a month ago    Pain Frequency  Intermittent    Aggravating Factors   some movements    Pain Relieving Factors  not sure          [facility] PT Assessment - 09/23/17 0001      Assessment   Medical Diagnosis  Lt lumbar radiculitis     Referring Provider  Dr Dianah Field    Onset Date/Surgical Date  07/10/17    Hand Dominance  Right    Next MD Visit  6/19    Prior Therapy  none       AROM   Lumbar Extension  50%      Flexibility   Hamstrings  tight bilat ~ 45-50 deg      Palpation   Spinal mobility  hypomobility with CPA mobs through lumbar spine     Palpation comment   muscular tightness through Lt lumbar paraspinals; QL into the Lt piriformis and hip abductors                    [facility] Adult PT Treatment/Exercise - 09/23/17 0001      Therapeutic Activites    Therapeutic Activities  -- myofacial ball release Lt hip in supine/standing       Lumbar Exercises: Stretches   Passive Hamstring Stretch  Left;Right;30 seconds;3 reps supine with strap     Standing Extension  2 reps 2-3 sec pause     Press Ups  -- 2 sec hold x 10 sec     Piriformis Stretch  Left;Right;3 reps;30 seconds supine travell       Lumbar Exercises: Aerobic   Nustep  L5 x 5 min       Moist Heat Therapy   Number Minutes Moist Heat  20 Minutes  Moist Heat Location  Lumbar Spine      Electrical Stimulation   Electrical Stimulation Location  bilat lumbar to Lt posterior hip/crest of pelvis area     Electrical Stimulation Action  IFC    Electrical Stimulation Parameters  to tolerance    Electrical Stimulation Goals  Pain;Tone      Manual Therapy   Manual therapy comments  pt prone     Joint Mobilization  CPA mobs lumbar spine Grade II/III    Soft tissue mobilization  deep tissue work through the Lt QL/lateral trunk; piriformis musculature     Myofascial Release  Lt lumbar to posterior hip     Passive ROM  IR/ER Lt hip in prone knee flexed              PT Education - 09/23/17 1017    Education provided  Yes    Education Details  HEP DN    Person(s) Educated  Patient    Methods  Explanation;Demonstration;Tactile cues;Verbal cues;Handout    Comprehension  Verbalized understanding;Returned demonstration;Verbal cues required          PT Long Term Goals - 09/23/17 0812      PT LONG TERM GOAL #1   Title  Improve trunk moblity with patient to demonstrate >/= 75% lumbar ROM in standing 10/29/17    Time  6    Period  Weeks    Status  On-going      PT LONG TERM GOAL #2   Title  Patient will demonstrate proper body mechanics with transfers and transitional  movements and demo/verbalize understanding of correct back care techniques 10/29/17    Time  6    Period  Weeks    Status  On-going      PT LONG TERM GOAL #3   Title  Patient reports 75-90% decrease in LBP and Lt LE pain 10/29/17    Time  6    Period  Weeks    Status  On-going      PT LONG TERM GOAL #4   Title  Independent in HEP 10/29/17    Time  6    Period  Weeks    Status  On-going      PT LONG TERM GOAL #5   Title  mprove FOTO to </= 29% limitation 10/29/17    Time  6    Period  Weeks    Status  On-going            Plan - 09/23/17 0846    Clinical Impression Statement  Patient responded well to intervention today with decreased pain and muscular tightness to palpation. Added piriformis stretch and discussed sleeping positions. Patient sleeps 3/4 prone to Lt and has for years - contributing directly to the current symptoms. Patient will attempt to change sleeping posture. Consider DN at next visit. Progress core work.     Rehab Potential  Good    PT Frequency  2x / week    PT Duration  6 weeks    PT Treatment/Interventions  Patient/family education;ADLs/Self Care Home Management;Cryotherapy;Electrical Stimulation;Iontophoresis 4mg /ml Dexamethasone;Moist Heat;Ultrasound;Dry needling;Manual techniques;Neuromuscular re-education;Therapeutic activities;Therapeutic exercise    PT Next Visit Plan  review HEP; add core stabilization; manual work v DN for Lt posterior hip musculature; modalities; back education     Consulted and Agree with Plan of Care  Patient       Patient will benefit from skilled therapeutic intervention in order to improve the following deficits and impairments:  Postural dysfunction, Improper  body mechanics, Pain, Increased muscle spasms, Increased fascial restricitons, Decreased range of motion, Decreased mobility  Visit Diagnosis: Acute bilateral low back pain with left-sided sciatica  Stiffness due to immobility  Abnormal posture  Other symptoms and  signs involving the musculoskeletal system     Problem List Patient Active Problem List   Diagnosis Date Noted  . Encounter for nonprocreative genetic counseling and testing 09/13/2017  . Regurgitation of food 07/26/2017  . Hypertension goal BP (blood pressure) < 130/80 07/26/2017  . Left lumbar radiculitis 07/24/2017  . Encounter for medication management 06/28/2017  . Habitual snoring 06/28/2017  . Long uvula 06/28/2017  . Excessive daytime sleepiness 06/28/2017  . Weight gain due to medication 05/31/2017  . Class 1 obesity due to excess calories without serious comorbidity in adult 05/31/2017  . Chronic non-seasonal allergic rhinitis 04/22/2017  . Nonintractable episodic headache 04/17/2017  . Epigastric pain 04/11/2017  . Non-intractable cyclical vomiting with nausea 02/28/2017  . Dyspepsia 02/28/2017  . Nausea 02/07/2017  . Paronychia of finger of right hand 02/07/2017  . Anxiety associated with depression 01/16/2017  . Bone lesion 01/10/2017  . Low libido 01/10/2017  . Tennis elbow 12/27/2016  . Knee pain, bilateral 12/27/2016  . Depression, major, single episode, moderate ([place]) 12/27/2016    Librada Castronovo Nilda Simmer PT, MPH  09/23/2017, 8:50 AM  [facility], [place], [address] Phone: [phone]   Fax:  [phone]  Name: Timmie Dugue MRN: [id number] Date of Birth: 1980/12/19

## 2017-09-25 ENCOUNTER — Ambulatory Visit (INDEPENDENT_AMBULATORY_CARE_PROVIDER_SITE_OTHER): Payer: BLUE CROSS/BLUE SHIELD | Admitting: Physician Assistant

## 2017-09-25 ENCOUNTER — Ambulatory Visit: Payer: BLUE CROSS/BLUE SHIELD | Admitting: Rehabilitative and Restorative Service Providers"

## 2017-09-25 ENCOUNTER — Encounter: Payer: Self-pay | Admitting: Physician Assistant

## 2017-09-25 VITALS — BP 123/83 | HR 81 | Temp 98.2°F | Resp 14 | Wt 245.0 lb

## 2017-09-25 DIAGNOSIS — A084 Viral intestinal infection, unspecified: Secondary | ICD-10-CM

## 2017-09-25 DIAGNOSIS — R29898 Other symptoms and signs involving the musculoskeletal system: Secondary | ICD-10-CM

## 2017-09-25 DIAGNOSIS — Z7409 Other reduced mobility: Secondary | ICD-10-CM

## 2017-09-25 DIAGNOSIS — R293 Abnormal posture: Secondary | ICD-10-CM | POA: Diagnosis not present

## 2017-09-25 DIAGNOSIS — R1031 Right lower quadrant pain: Secondary | ICD-10-CM | POA: Diagnosis not present

## 2017-09-25 DIAGNOSIS — M5442 Lumbago with sciatica, left side: Secondary | ICD-10-CM

## 2017-09-25 DIAGNOSIS — M2560 Stiffness of unspecified joint, not elsewhere classified: Secondary | ICD-10-CM

## 2017-09-25 MED ORDER — METOCLOPRAMIDE HCL 5 MG PO TABS: 5.0000 mg | ORAL_TABLET | Freq: Four times a day (QID) | ORAL | 0 refills | Status: DC | PRN

## 2017-09-25 MED ORDER — METOCLOPRAMIDE HCL 5 MG/ML IJ SOLN
10.0000 mg | Freq: Once | INTRAVENOUS | Status: AC
  Administered 2017-09-25: 10 mg via INTRAMUSCULAR

## 2017-09-25 NOTE — Patient Instructions (Addendum)
  Quads / HF, Prone KNEE: Quadriceps - Prone    Place strap around ankle. Bring ankle toward buttocks. Press hip into surface. Hold 30 seconds. Repeat 3 times per session. Do 2-3 sessions per day.  Bridging    Slowly raise buttocks from floor, keeping core tight. Repeat __10_ times per set. Do __1-2__ sets per session. Do __1-2__ sessions per day.   Strengthening: Hip Abductor - Resisted    With band looped around both legs above knees, push one knee out to side keeping opposite knee still. Repeat with opposite leg.  Repeat __10__ times per set. Do __1-2__ sets per session. Do __1-2__ sessions per day.

## 2017-09-25 NOTE — Progress Notes (Signed)
HPI:                                                                Troy Wilkerson is a 37 y.o. male who presents to [facility]: [place] today for nausea and vomiting  Emesis   This is a new problem. The current episode started in the past 7 days (x 3 days). The problem occurs 2 to 4 times per day. The problem has been unchanged. The emesis has an appearance of stomach contents. The maximum temperature recorded prior to his arrival was 100.4 - 100.9 F. Associated symptoms include abdominal pain, chills, diarrhea (loose BM last night), a fever, headaches and myalgias. Risk factors include ill contacts. He has tried increased fluids (anti-emetics) for the symptoms.      Depression screen [facility] 2/9 09/13/2017 07/26/2017 06/28/2017 05/31/2017 04/11/2017  Decreased Interest 2 2 3 1 2   Down, Depressed, Hopeless 2 2 2 1 1   PHQ - 2 Score 4 4 5 2 3   Altered sleeping 2 2 2 1 2   Tired, decreased energy 2 2 3 2 2   Change in appetite 1 1 2 2 1   Feeling bad or failure about yourself  1 1 2 1 1   Trouble concentrating 2 1 1 2 2   Moving slowly or fidgety/restless 2 0 1 1 1   Suicidal thoughts 1 0 1 0 0  PHQ-9 Score 15 11 17 11 12   Difficult doing work/chores Somewhat difficult Somewhat difficult - Somewhat difficult -  Some recent data might be hidden    GAD 7 : Generalized Anxiety Score 09/13/2017 07/26/2017 06/28/2017 05/31/2017  Nervous, Anxious, on Edge 2 2 2 2   Control/stop worrying 3 2 2 2   Worry too much - different things 2 2 3 2   Trouble relaxing 3 2 2 2   Restless 2 1 1 1   Easily annoyed or irritable 2 1 0 1  Afraid - awful might happen 1 1 0 1  Total GAD 7 Score 15 11 10 11   Anxiety Difficulty Somewhat difficult Somewhat difficult - -      Past Medical History:  Diagnosis Date  . Anxiety   . Benign tumor of thoracic site    47 mos old  . Depression   . Obesity    Past Surgical History:  Procedure Laterality Date  . KNEE ARTHROSCOPY     left 2011  .  THORACOTOMY     79 mos old   Social History   Tobacco Use  . Smoking status: Never Smoker  . Smokeless tobacco: Never Used  Substance Use Topics  . Alcohol use: No   family history includes Cancer in his mother; Diabetes in his mother; Heart disease in his mother; Stroke in his brother.    ROS: negative except as noted in the HPI  Medications: Current Outpatient Medications  Medication Sig Dispense Refill  . amoxicillin-clavulanate (AUGMENTIN) 875-125 MG tablet Take 1 tablet by mouth 2 (two) times daily for 10 days. 20 tablet 0  . fexofenadine-pseudoephedrine (ALLEGRA-D ALLERGY & CONGESTION) 180-240 MG 24 hr tablet Take 1 tablet by mouth daily.    . fluticasone (FLONASE) 50 MCG/ACT nasal spray Place 1 spray into both nostrils daily. 16 g 1  . fluvoxaMINE (LUVOX) 25 MG  tablet Take 1 tablet (25 mg total) by mouth at bedtime for 7 days. 7 tablet 0  . meloxicam (MOBIC) 15 MG tablet One tab PO qAM with breakfast for 2 weeks, then daily prn pain. 30 tablet 3  . nortriptyline (PAMELOR) 50 MG capsule Take 1 capsule (50 mg total) by mouth at bedtime. 30 capsule 5  . omeprazole (PRILOSEC) 40 MG capsule Take 1 capsule (40 mg total) by mouth daily. 30 capsule 3  . promethazine (PHENERGAN) 25 MG tablet Take 1 tablet (25 mg total) by mouth at bedtime as needed for nausea or vomiting. 30 tablet 2  . metoCLOPramide (REGLAN) 5 MG tablet Take 1 tablet (5 mg total) by mouth every 6 (six) hours as needed for nausea or vomiting. 20 tablet 0   No current facility-administered medications for this visit.    Allergies  Allergen Reactions  . Cymbalta [Duloxetine Hcl] Nausea And Vomiting    GeneSight: significant gene-drug interaction  . Luvox [Fluvoxamine] Other (See Comments)    GeneSight: significant gene-drug interaction       Objective:  BP 123/83   Pulse 81   Temp 98.2 F (36.8 C) (Oral)   Resp 14   Wt 245 lb (111.1 kg)   BMI 33.22 kg/m  Gen:  alert, not ill-appearing, no distress,  appropriate for age, obese male HEENT: head normocephalic without obvious abnormality, conjunctiva and cornea clear, wearing glasses, TM's pearly gray and semi-transparent, oropharynx clear, moist mucous membranes, nasal mucosa pink, no sinus tenderness, neck supple, no cervical adenopathy, trachea midline Pulm: Normal work of breathing, normal phonation, clear to auscultation bilaterally, no wheezes, rales or rhonchi CV: Normal rate, regular rhythm, s1 and s2 distinct, no murmurs, clicks or rubs  GI: abdomen soft, there is RLQ tenderness without guarding or rebound tenderness Neuro: alert and oriented x 3, no tremor MSK: extremities atraumatic, normal gait and station Skin: intact, no rashes on exposed skin, no jaundice, no cyanosis  No results found for this or any previous visit (from the past 72 hour(s)). No results found.    Assessment and Plan: 37 y.o. male with   Viral gastroenteritis - Plan: metoCLOPramide (REGLAN) 10 mg in dextrose 5 % 50 mL IVPB, metoCLOPramide (REGLAN) 5 MG tablet  RLQ abdominal pain - Plan: metoCLOPramide (REGLAN) 10 mg in dextrose 5 % 50 mL IVPB  RLQ tenderness on exam without rebound/guarding/rigidity. Afebrile, no tachycardia, no tachypnea. Known sick contacts (kids at home with similar symptoms). Most likely viral gastroenteritis  Reglan 10mg  IM given in office and prescription for 5 mg PO Q6H prn. Instructed not to combine with Phenergan Supportive care    Patient education and anticipatory guidance given Patient agrees with treatment plan Follow-up in 2 days if symptoms worsen or fail to improve  Darlyne Russian PA-C

## 2017-09-25 NOTE — Therapy (Signed)
[facility] [address] [place], [place], [address] Phone: [phone]   Fax:  [phone]  Physical Therapy Treatment  Patient Details  Name: Troy Wilkerson MRN: [id number] Date of Birth: May 22, 1980 Referring Provider: Dr Dianah Field   Encounter Date: 09/25/2017  PT End of Session - 09/25/17 0828    Visit Number  3    Number of Visits  12    Date for PT Re-Evaluation  10/23/17    PT Start Time  0805    PT Stop Time  0901    PT Time Calculation (min)  56 min    Activity Tolerance  Patient tolerated treatment well       Past Medical History:  Diagnosis Date  . Anxiety   . Benign tumor of thoracic site    99 mos old  . Depression   . Obesity     Past Surgical History:  Procedure Laterality Date  . KNEE ARTHROSCOPY     left 2011  . THORACOTOMY     6 mos old    There were no vitals filed for this visit.  Subjective Assessment - 09/25/17 0812    Subjective  Some soreness today. Had increased aching after last visit and stomach bug - likely from children. Working on exercises some at home.     Currently in Pain?  Yes    Pain Score  4     Pain Location  Back    Pain Orientation  Left                       [facility] Adult PT Treatment/Exercise - 09/25/17 0001      Lumbar Exercises: Stretches   Standing Extension  2 reps 2-3 sec pause     Press Ups  -- 2 sec hold x 10 sec     Quad Stretch  Right;Left;3 reps prone with strap     Piriformis Stretch  Left;Right;3 reps;30 seconds supine travell       Lumbar Exercises: Supine   Clam  10 reps;3 seconds holding one LE still moving opposite green TB     Bridge  10 reps;5 seconds core engaged    Other Supine Lumbar Exercises  3 part core 10 sec x 10  encouraging core with all exercises       Moist Heat Therapy   Number Minutes Moist Heat  20 Minutes    Moist Heat Location  Lumbar Spine      Electrical Stimulation   Electrical Stimulation Location   bilat lumbar to Lt posterior hip/crest of pelvis area     Electrical Stimulation Action  IFC    Electrical Stimulation Parameters  to tolerance    Electrical Stimulation Goals  Pain;Tone      Manual Therapy   Manual therapy comments  pt prone     Joint Mobilization  CPA mobs lumbar spine Grade II/III    Soft tissue mobilization  deep tissue work through the Lt QL/lateral trunk; piriformis musculature     Myofascial Release  Lt lumbar to posterior hip     Passive ROM  IR/ER Lt hip in prone knee flexed              PT Education - 09/25/17 0821    Education provided  Yes    Education Details  HEP     Person(s) Educated  Patient    Methods  Explanation;Demonstration;Tactile cues;Verbal cues;Handout    Comprehension  Verbalized understanding;Returned  demonstration;Verbal cues required;Tactile cues required          PT Long Term Goals - 09/23/17 0812      PT LONG TERM GOAL #1   Title  Improve trunk moblity with patient to demonstrate >/= 75% lumbar ROM in standing 10/29/17    Time  6    Period  Weeks    Status  On-going      PT LONG TERM GOAL #2   Title  Patient will demonstrate proper body mechanics with transfers and transitional movements and demo/verbalize understanding of correct back care techniques 10/29/17    Time  6    Period  Weeks    Status  On-going      PT LONG TERM GOAL #3   Title  Patient reports 75-90% decrease in LBP and Lt LE pain 10/29/17    Time  6    Period  Weeks    Status  On-going      PT LONG TERM GOAL #4   Title  Independent in HEP 10/29/17    Time  6    Period  Weeks    Status  On-going      PT LONG TERM GOAL #5   Title  mprove FOTO to </= 29% limitation 10/29/17    Time  6    Period  Weeks    Status  On-going            Plan - 09/25/17 0813    Clinical Impression Statement  Patient is dealing with a stomach virus. Not feeling well. Not sure he wants to try the DN for the soreness. Will assess response to exercise and decide if DN is  indicated at nest visit. Progressing well with exercises.     Rehab Potential  Good    PT Frequency  2x / week    PT Duration  6 weeks    PT Treatment/Interventions  Patient/family education;ADLs/Self Care Home Management;Cryotherapy;Electrical Stimulation;Iontophoresis 4mg /ml Dexamethasone;Moist Heat;Ultrasound;Dry needling;Manual techniques;Neuromuscular re-education;Therapeutic activities;Therapeutic exercise    PT Next Visit Plan  review HEP; add core stabilization; manual work v DN for Lt posterior hip musculature; modalities; back education     Consulted and Agree with Plan of Care  Patient       Patient will benefit from skilled therapeutic intervention in order to improve the following deficits and impairments:  Postural dysfunction, Improper body mechanics, Pain, Increased muscle spasms, Increased fascial restricitons, Decreased range of motion, Decreased mobility  Visit Diagnosis: Acute bilateral low back pain with left-sided sciatica  Stiffness due to immobility  Abnormal posture  Other symptoms and signs involving the musculoskeletal system     Problem List Patient Active Problem List   Diagnosis Date Noted  . Encounter for nonprocreative genetic counseling and testing 09/13/2017  . Regurgitation of food 07/26/2017  . Hypertension goal BP (blood pressure) < 130/80 07/26/2017  . Left lumbar radiculitis 07/24/2017  . Encounter for medication management 06/28/2017  . Habitual snoring 06/28/2017  . Long uvula 06/28/2017  . Excessive daytime sleepiness 06/28/2017  . Weight gain due to medication 05/31/2017  . Class 1 obesity due to excess calories without serious comorbidity in adult 05/31/2017  . Chronic non-seasonal allergic rhinitis 04/22/2017  . Nonintractable episodic headache 04/17/2017  . Epigastric pain 04/11/2017  . Non-intractable cyclical vomiting with nausea 02/28/2017  . Dyspepsia 02/28/2017  . Nausea 02/07/2017  . Paronychia of finger of right hand  02/07/2017  . Anxiety associated with depression 01/16/2017  . Bone lesion 01/10/2017  .  Low libido 01/10/2017  . Tennis elbow 12/27/2016  . Knee pain, bilateral 12/27/2016  . Depression, major, single episode, moderate ([place]) 12/27/2016    Gustin Zobrist Nilda Simmer PT, MPH  09/25/2017, 8:38 AM  [facility] [address] [place], [place], [address] Phone: [phone]   Fax:  [phone]  Name: Troy Wilkerson MRN: [id number] Date of Birth: [birth year]

## 2017-09-25 NOTE — Patient Instructions (Addendum)
- bowel rest, clear liquid diet for 24 hours or until symptoms improve. Slowly introduce bland foods - take small sips of diluted Pedialyte/Gatorade every 10-15 minutes to stay hydrated - Metoclopramide 5 mg every 6 hours as needed for nausea/vomiting in place of Zofran (Ondansetron) - Do not combine Metoclopramide with Phenergan, use one or the other    Viral Gastroenteritis, Adult Viral gastroenteritis is also known as the stomach flu. This condition is caused by various viruses. These viruses can be passed from person to person very easily (are very contagious). This condition may affect your stomach, small intestine, and large intestine. It can cause sudden watery diarrhea, fever, and vomiting. Diarrhea and vomiting can make you feel weak and cause you to become dehydrated. You may not be able to keep fluids down. Dehydration can make you tired and thirsty, cause you to have a dry mouth, and decrease how often you urinate. Older adults and people with other diseases or a weak immune system are at higher risk for dehydration. It is important to replace the fluids that you lose from diarrhea and vomiting. If you become severely dehydrated, you may need to get fluids through an IV tube. What are the causes? Gastroenteritis is caused by various viruses, including rotavirus and norovirus. Norovirus is the most common cause in adults. You can get sick by eating food, drinking water, or touching a surface contaminated with one of these viruses. You can also get sick from sharing utensils or other personal items with an infected person. What increases the risk? This condition is more likely to develop in people:  Who have a weak defense system (immune system).  Who live with one or more children who are younger than 3 years old.  Who live in a nursing home.  Who go on cruise ships.  What are the signs or symptoms? Symptoms of this condition start suddenly 1-2 days after exposure to a virus.  Symptoms may last a few days or as long as a week. The most common symptoms are watery diarrhea and vomiting. Other symptoms include:  Fever.  Headache.  Fatigue.  Pain in the abdomen.  Chills.  Weakness.  Nausea.  Muscle aches.  Loss of appetite.  How is this diagnosed? This condition is diagnosed with a medical history and physical exam. You may also have a stool test to check for viruses or other infections. How is this treated? This condition typically goes away on its own. The focus of treatment is to restore lost fluids (rehydration). Your health care provider may recommend that you take an oral rehydration solution (ORS) to replace important salts and minerals (electrolytes) in your body. Severe cases of this condition may require giving fluids through an IV tube. Treatment may also include medicine to help with your symptoms. Follow these instructions at home: Follow instructions from your health care provider about how to care for yourself at home. Eating and drinking Follow these recommendations as told by your health care provider:  Take an ORS. This is a drink that is sold at pharmacies and retail stores.  Drink clear fluids in small amounts as you are able. Clear fluids include water, ice chips, diluted fruit juice, and low-calorie sports drinks.  Eat bland, easy-to-digest foods in small amounts as you are able. These foods include bananas, applesauce, rice, lean meats, toast, and crackers.  Avoid fluids that contain a lot of sugar or caffeine, such as energy drinks, sports drinks, and soda.  Avoid alcohol.  Avoid spicy  or fatty foods.  General instructions   Drink enough fluid to keep your urine clear or pale yellow.  Wash your hands often. If soap and water are not available, use hand sanitizer.  Make sure that all people in your household wash their hands well and often.  Take over-the-counter and prescription medicines only as told by your health  care provider.  Rest at home while you recover.  Watch your condition for any changes.  Take a warm bath to relieve any burning or pain from frequent diarrhea episodes.  Keep all follow-up visits as told by your health care provider. This is important. Contact a health care provider if:  You cannot keep fluids down.  Your symptoms get worse.  You have new symptoms.  You feel light-headed or dizzy.  You have muscle cramps. Get help right away if:  You have chest pain.  You feel extremely weak or you faint.  You see blood in your vomit.  Your vomit looks like coffee grounds.  You have bloody or black stools or stools that look like tar.  You have a severe headache, a stiff neck, or both.  You have a rash.  You have severe pain, cramping, or bloating in your abdomen.  You have trouble breathing or you are breathing very quickly.  Your heart is beating very quickly.  Your skin feels cold and clammy.  You feel confused.  You have pain when you urinate.  You have signs of dehydration, such as: ? Dark urine, very little urine, or no urine. ? Cracked lips. ? Dry mouth. ? Sunken eyes. ? Sleepiness. ? Weakness. This information is not intended to replace advice given to you by your health care provider. Make sure you discuss any questions you have with your health care provider. Document Released: 04/09/2005 Document Revised: 09/21/2015 Document Reviewed: 12/14/2014 Elsevier Interactive Patient Education  2018 [place] Liquid Diet, Adult A clear liquid diet is a diet that includes only liquids that you can see through. You may need to follow a clear liquid diet if:  You develop a medical condition right before or after you have surgery.  You were not able to eat food for a long period of time.  You had a condition that gave you diarrhea.  You are going to have an exam, such as a colonoscopy, in which instruments will be put into your body to  look at parts of your digestive system.  You are going to have bowel surgery.  The usual goals of this diet are:  To rest the stomach and digestive system as much as possible.  To keep you hydrated.  To make sure you get some calories for energy.  To help you return to normal digestion.  Most people need to follow this diet for only a short period of time. What do I need to know about this diet?  A clear liquid is a liquid that you can see through when you hold it up to a light.  A clear liquid diet does not provide all the nutrients that you need. It is important to choose a variety of the liquids that are allowed on this diet. That way, you will get as many nutrients as possible.  If you are not sure whether you can have certain items, ask your health care provider. What can I have?  Water and flavored water.  Fruit juices that do not have pulp, such as cranberry juice and apple juice.  Tea and coffee without milk or cream.  Clear bouillon or broth.  Broth-based soups that have been strained.  Flavored gelatins.  Honey.  Sugar water.  Frozen ice or frozen ice pops that do not contain milk, yogurt, fruit pieces, or fruit pulp.  Clear sodas.  Clear sports drinks. The items listed above may not be a complete list of recommended liquids. Contact your dietitian for more options. What can I not have?  Juices that have pulp.  Milk.  Cream or cream-based soups.  Yogurt. The items listed above may not be a complete list of liquids to avoid. Contact your dietitian for more information. Summary  A clear liquid diet is a diet that includes only liquids that you can see through.  The goal of this diet is to help you recover by resting your digestive system, keeping you hydrated, and providing nutrients.  Make sure to avoid liquids with milk, cream, or pulp while on this diet. This information is not intended to replace advice given to you by your health care  provider. Make sure you discuss any questions you have with your health care provider. Document Released: 04/09/2005 Document Revised: 11/22/2015 Document Reviewed: 03/06/2013 Elsevier Interactive Patient Education  [employer].

## 2017-09-27 ENCOUNTER — Ambulatory Visit (INDEPENDENT_AMBULATORY_CARE_PROVIDER_SITE_OTHER): Payer: BLUE CROSS/BLUE SHIELD | Admitting: Physician Assistant

## 2017-09-27 ENCOUNTER — Encounter: Payer: Self-pay | Admitting: Physician Assistant

## 2017-09-27 VITALS — BP 126/85 | HR 74 | Temp 98.2°F | Wt 245.0 lb

## 2017-09-27 DIAGNOSIS — R1114 Bilious vomiting: Secondary | ICD-10-CM

## 2017-09-27 DIAGNOSIS — E860 Dehydration: Secondary | ICD-10-CM | POA: Diagnosis not present

## 2017-09-27 DIAGNOSIS — R1084 Generalized abdominal pain: Secondary | ICD-10-CM | POA: Diagnosis not present

## 2017-09-27 DIAGNOSIS — R197 Diarrhea, unspecified: Secondary | ICD-10-CM | POA: Diagnosis not present

## 2017-09-27 DIAGNOSIS — R1031 Right lower quadrant pain: Secondary | ICD-10-CM | POA: Diagnosis not present

## 2017-09-27 MED ORDER — SODIUM CHLORIDE 0.9 % IV SOLN
4.0000 mg | Freq: Once | INTRAVENOUS | Status: AC
  Administered 2017-09-27: 4 mg via INTRAVENOUS

## 2017-09-27 MED ORDER — DIPHENOXYLATE-ATROPINE 2.5-0.025 MG PO TABS: 1.0000 | ORAL_TABLET | Freq: Four times a day (QID) | ORAL | 0 refills | Status: DC | PRN

## 2017-09-27 NOTE — Patient Instructions (Addendum)
Continue Phenergan 25 mg every 8 hours as needed for nausea/vomiting Start Lomotil. Take every 6 hours as needed for diarrhea Continue bowel rest, Pedialyte for hydration Rest  Dehydration, Adult Dehydration is a condition in which there is not enough fluid or water in the body. This happens when you lose more fluids than you take in. Important organs, such as the kidneys, brain, and heart, cannot function without a proper amount of fluids. Any loss of fluids from the body can lead to dehydration. Dehydration can range from mild to severe. This condition should be treated right away to prevent it from becoming severe. What are the causes? This condition may be caused by:  Vomiting.  Diarrhea.  Excessive sweating, such as from heat exposure or exercise.  Not drinking enough fluid, especially: ? When ill. ? While doing activity that requires a lot of energy.  Excessive urination.  Fever.  Infection.  Certain medicines, such as medicines that cause the body to lose excess fluid (diuretics).  Inability to access safe drinking water.  Reduced physical ability to get adequate water and food.  What increases the risk? This condition is more likely to develop in people:  Who have a poorly controlled long-term (chronic) illness, such as diabetes, heart disease, or kidney disease.  Who are age 34 or older.  Who are disabled.  Who live in a place with high altitude.  Who play endurance sports.  What are the signs or symptoms? Symptoms of mild dehydration may include:  Thirst.  Dry lips.  Slightly dry mouth.  Dry, warm skin.  Dizziness. Symptoms of moderate dehydration may include:  Very dry mouth.  Muscle cramps.  Dark urine. Urine may be the color of tea.  Decreased urine production.  Decreased tear production.  Heartbeat that is irregular or faster than normal (palpitations).  Headache.  Light-headedness, especially when you stand up from a sitting  position.  Fainting (syncope). Symptoms of severe dehydration may include:  Changes in skin, such as: ? Cold and clammy skin. ? Blotchy (mottled) or pale skin. ? Skin that does not quickly return to normal after being lightly pinched and released (poor skin turgor).  Changes in body fluids, such as: ? Extreme thirst. ? No tear production. ? Inability to sweat when body temperature is high, such as in hot weather. ? Very little urine production.  Changes in vital signs, such as: ? Weak pulse. ? Pulse that is more than 100 beats a minute when sitting still. ? Rapid breathing. ? Low blood pressure.  Other changes, such as: ? Sunken eyes. ? Cold hands and feet. ? Confusion. ? Lack of energy (lethargy). ? Difficulty waking up from sleep. ? Short-term weight loss. ? Unconsciousness. How is this diagnosed? This condition is diagnosed based on your symptoms and a physical exam. Blood and urine tests may be done to help confirm the diagnosis. How is this treated? Treatment for this condition depends on the severity. Mild or moderate dehydration can often be treated at home. Treatment should be started right away. Do not wait until dehydration becomes severe. Severe dehydration is an emergency and it needs to be treated in a hospital. Treatment for mild dehydration may include:  Drinking more fluids.  Replacing salts and minerals in your blood (electrolytes) that you may have lost. Treatment for moderate dehydration may include:  Drinking an oral rehydration solution (ORS). This is a drink that helps you replace fluids and electrolytes (rehydrate). It can be found at pharmacies and retail  stores. Treatment for severe dehydration may include:  Receiving fluids through an IV tube.  Receiving an electrolyte solution through a feeding tube that is passed through your nose and into your stomach (nasogastric tube, or NG tube).  Correcting any abnormalities in electrolytes.  Treating  the underlying cause of dehydration. Follow these instructions at home:  If directed by your health care provider, drink an ORS: ? Make an ORS by following instructions on the package. ? Start by drinking small amounts, about  cup (120 mL) every 5-10 minutes. ? Slowly increase how much you drink until you have taken the amount recommended by your health care provider.  Drink enough clear fluid to keep your urine clear or pale yellow. If you were told to drink an ORS, finish the ORS first, then start slowly drinking other clear fluids. Drink fluids such as: ? Water. Do not drink only water. Doing that can lead to having too little salt (sodium) in the body (hyponatremia). ? Ice chips. ? Fruit juice that you have added water to (diluted fruit juice). ? Low-calorie sports drinks.  Avoid: ? Alcohol. ? Drinks that contain a lot of sugar. These include high-calorie sports drinks, fruit juice that is not diluted, and soda. ? Caffeine. ? Foods that are greasy or contain a lot of fat or sugar.  Take over-the-counter and prescription medicines only as told by your health care provider.  Do not take sodium tablets. This can lead to having too much sodium in the body (hypernatremia).  Eat foods that contain a healthy balance of electrolytes, such as bananas, oranges, potatoes, tomatoes, and spinach.  Keep all follow-up visits as told by your health care provider. This is important. Contact a health care provider if:  You have abdominal pain that: ? Gets worse. ? Stays in one area (localizes).  You have a rash.  You have a stiff neck.  You are more irritable than usual.  You are sleepier or more difficult to wake up than usual.  You feel weak or dizzy.  You feel very thirsty.  You have urinated only a small amount of very dark urine over 6-8 hours. Get help right away if:  You have symptoms of severe dehydration.  You cannot drink fluids without vomiting.  Your symptoms get  worse with treatment.  You have a fever.  You have a severe headache.  You have vomiting or diarrhea that: ? Gets worse. ? Does not go away.  You have blood or green matter (bile) in your vomit.  You have blood in your stool. This may cause stool to look black and tarry.  You have not urinated in 6-8 hours.  You faint.  Your heart rate while sitting still is over 100 beats a minute.  You have trouble breathing. This information is not intended to replace advice given to you by your health care provider. Make sure you discuss any questions you have with your health care provider. Document Released: 04/09/2005 Document Revised: 11/04/2015 Document Reviewed: 06/03/2015 Elsevier Interactive Patient Education  [employer].

## 2017-09-27 NOTE — Progress Notes (Signed)
HPI:                                                                Troy Wilkerson is a 37 y.o. male who presents to [place]: [facility] today for nausea and vomiting  37 yo M with PMH of cyclic vomiting syndrome, anxiety, depression presents for follow-up of abdominal pain, n/v/d. Symptoms have been present for 5 days now, unchanged. Reports he tried to drink some broth last night and has had profuse watery diarrhea ever since. Reports he has not urinated since yesterday. Vomited this morning, stomach contents and bile. Denies coffee ground emesis/hematemsis or hematochezia. Continues to have nausea and vomiting despite multiple antiemetics (Phenergan, Zofran, Reglan).   Past Medical History:  Diagnosis Date  . Anxiety   . Benign tumor of thoracic site    19 mos old  . Depression   . Obesity    Past Surgical History:  Procedure Laterality Date  . KNEE ARTHROSCOPY     left 2011  . THORACOTOMY     37 mos old   Social History   Tobacco Use  . Smoking status: Never Smoker  . Smokeless tobacco: Never Used  Substance Use Topics  . Alcohol use: No   family history includes Cancer in his mother; Diabetes in his mother; Heart disease in his mother; Stroke in his brother.    ROS: negative except as noted in the HPI  Medications: Current Outpatient Medications  Medication Sig Dispense Refill  . amoxicillin-clavulanate (AUGMENTIN) 875-125 MG tablet Take 1 tablet by mouth 2 (two) times daily for 10 days. 20 tablet 0  . fexofenadine-pseudoephedrine (ALLEGRA-D ALLERGY & CONGESTION) 180-240 MG 24 hr tablet Take 1 tablet by mouth daily.    . fluticasone (FLONASE) 50 MCG/ACT nasal spray Place 1 spray into both nostrils daily. 16 g 1  . fluvoxaMINE (LUVOX) 25 MG tablet Take 1 tablet (25 mg total) by mouth at bedtime for 7 days. 7 tablet 0  . meloxicam (MOBIC) 15 MG tablet One tab PO qAM with breakfast for 2 weeks, then daily prn pain. 30 tablet 3  .  metoCLOPramide (REGLAN) 5 MG tablet Take 1 tablet (5 mg total) by mouth every 6 (six) hours as needed for nausea or vomiting. 20 tablet 0  . nortriptyline (PAMELOR) 50 MG capsule Take 1 capsule (50 mg total) by mouth at bedtime. 30 capsule 5  . omeprazole (PRILOSEC) 40 MG capsule Take 1 capsule (40 mg total) by mouth daily. 30 capsule 3  . promethazine (PHENERGAN) 25 MG tablet Take 1 tablet (25 mg total) by mouth at bedtime as needed for nausea or vomiting. 30 tablet 2  . diphenoxylate-atropine (LOMOTIL) 2.5-0.025 MG tablet Take 1 tablet by mouth 4 (four) times daily as needed for diarrhea or loose stools. 16 tablet 0   No current facility-administered medications for this visit.    Allergies  Allergen Reactions  . Cymbalta [Duloxetine Hcl] Nausea And Vomiting    GeneSight: significant gene-drug interaction  . Luvox [Fluvoxamine] Other (See Comments)    GeneSight: significant gene-drug interaction       Objective:  BP 126/85   Pulse 74   Temp 98.2 F (36.8 C) (Oral)   Wt 245 lb (111.1 kg)  BMI 33.22 kg/m  Gen:  alert, ill-appearing, not toxic-appearing, no acute distress, appropriate for age, obese male HEENT: head normocephalic without obvious abnormality, conjunctiva and cornea clear, wearing glasses, mucous membranes dry, trachea midline Pulm: Normal work of breathing, normal phonation, clear to auscultation bilaterally, no wheezes, rales or rhonchi CV: Normal rate, regular rhythm, s1 and s2 distinct, no murmurs, clicks or rubs  GI: abdomen soft, there is diffuse tenderness without guarding or rebound Neuro: alert and oriented x 3, no tremor MSK: extremities atraumatic, normal gait and station Skin: intact, no rashes on exposed skin, no jaundice, no cyanosis  No results found for this or any previous visit (from the past 72 hour(s)). No results found.  Procedure: IV Insertion Indication: acute dehydration 2/2 vomiting and diarrhea Inserted #20 gauge, 1-inch BD Insyte  angiocatheter in the left antecubital vein on 1 attempt. Good blood return. Flushed with normal saline.       Assessment and Plan: 37 y.o. male with   Acute dehydration  Diarrhea of presumed infectious origin - Plan: Gastrointestinal Pathogen Panel PCR, Stool Culture, Ova and parasite examination, C. difficile GDH and Toxin A/B, diphenoxylate-atropine (LOMOTIL) 2.5-0.025 MG tablet  Generalized abdominal pain - Plan: CBC with Differential/Platelet, Comprehensive metabolic panel, Lipase  Bilious vomiting with nausea - Plan: ondansetron (ZOFRAN) 4 mg in sodium chloride 0.9 % 50 mL IVPB   Diffuse abdominal tenderness on exam without rebound/guarding/rigidity. Afebrile, no tachycardia, no tachypnea. Will continue to defer CT Abdomen for now. Will check stool studies, including viral PCR panel given persistence of symptoms for 5 days without improvement Patient was given 2L of NS IV in office today and was able to void. Also given IV zofran 4 mg Starting Lomotil QID prn for diarrhea Continue bowel rest and supportive care ER precautions given for the weekend Stool studies and labs pending   Patient education and anticipatory guidance given Patient agrees with treatment plan Follow-up as needed if symptoms worsen or fail to improve  Darlyne Russian PA-C

## 2017-09-28 LAB — LIPASE: Lipase: 14 U/L (ref 7–60)

## 2017-09-28 LAB — CBC WITH DIFFERENTIAL/PLATELET
BASOS ABS: 27 {cells}/uL (ref 0–200)
BASOS PCT: 0.3 %
EOS ABS: 91 {cells}/uL (ref 15–500)
EOS PCT: 1 %
HEMATOCRIT: 40.3 % (ref 38.5–50.0)
HEMOGLOBIN: 14.1 g/dL (ref 13.2–17.1)
LYMPHS ABS: 1565 {cells}/uL (ref 850–3900)
MCH: 30.1 pg (ref 27.0–33.0)
MCHC: 35 g/dL (ref 32.0–36.0)
MCV: 85.9 fL (ref 80.0–100.0)
MPV: 11.2 fL (ref 7.5–12.5)
Monocytes Relative: 10.8 %
NEUTROS ABS: 6434 {cells}/uL (ref 1500–7800)
Neutrophils Relative %: 70.7 %
Platelets: 275 10*3/uL (ref 140–400)
RBC: 4.69 10*6/uL (ref 4.20–5.80)
RDW: 12.5 % (ref 11.0–15.0)
Total Lymphocyte: 17.2 %
WBC mixed population: 983 cells/uL — ABNORMAL HIGH (ref 200–950)
WBC: 9.1 10*3/uL (ref 3.8–10.8)

## 2017-09-28 LAB — COMPREHENSIVE METABOLIC PANEL
AG Ratio: 1.8 (calc) (ref 1.0–2.5)
ALBUMIN MSPROF: 4.1 g/dL (ref 3.6–5.1)
ALT: 45 U/L (ref 9–46)
AST: 30 U/L (ref 10–40)
Alkaline phosphatase (APISO): 76 U/L (ref 40–115)
BUN: 12 mg/dL (ref 7–25)
CALCIUM: 8.6 mg/dL (ref 8.6–10.3)
CO2: 25 mmol/L (ref 20–32)
CREATININE: 1.05 mg/dL (ref 0.60–1.35)
Chloride: 109 mmol/L (ref 98–110)
GLUCOSE: 78 mg/dL (ref 65–99)
Globulin: 2.3 g/dL (calc) (ref 1.9–3.7)
POTASSIUM: 3.8 mmol/L (ref 3.5–5.3)
SODIUM: 142 mmol/L (ref 135–146)
TOTAL PROTEIN: 6.4 g/dL (ref 6.1–8.1)
Total Bilirubin: 0.9 mg/dL (ref 0.2–1.2)

## 2017-09-30 ENCOUNTER — Encounter: Payer: Self-pay | Admitting: Rehabilitative and Restorative Service Providers"

## 2017-09-30 ENCOUNTER — Ambulatory Visit: Payer: BLUE CROSS/BLUE SHIELD | Admitting: Rehabilitative and Restorative Service Providers"

## 2017-09-30 DIAGNOSIS — M5442 Lumbago with sciatica, left side: Secondary | ICD-10-CM

## 2017-09-30 DIAGNOSIS — R293 Abnormal posture: Secondary | ICD-10-CM

## 2017-09-30 DIAGNOSIS — Z7409 Other reduced mobility: Secondary | ICD-10-CM | POA: Diagnosis not present

## 2017-09-30 DIAGNOSIS — R29898 Other symptoms and signs involving the musculoskeletal system: Secondary | ICD-10-CM | POA: Diagnosis not present

## 2017-09-30 DIAGNOSIS — M2560 Stiffness of unspecified joint, not elsewhere classified: Secondary | ICD-10-CM | POA: Diagnosis not present

## 2017-09-30 LAB — GASTROINTESTINAL PATHOGEN PANEL PCR
C. difficile Tox A/B, PCR: NOT DETECTED
Campylobacter, PCR: NOT DETECTED
Cryptosporidium, PCR: NOT DETECTED
E COLI (ETEC) LT/ST, PCR: NOT DETECTED
E COLI 0157, PCR: NOT DETECTED
E coli (STEC) stx1/stx2, PCR: NOT DETECTED
GIARDIA LAMBLIA, PCR: NOT DETECTED
Norovirus, PCR: NOT DETECTED
Rotavirus A, PCR: DETECTED — AB
Salmonella, PCR: NOT DETECTED
Shigella, PCR: NOT DETECTED

## 2017-09-30 LAB — C. DIFFICILE GDH AND TOXIN A/B
GDH ANTIGEN: NOT DETECTED
MICRO NUMBER:: 90689175
SPECIMEN QUALITY: ADEQUATE
TOXIN A AND B: NOT DETECTED

## 2017-09-30 NOTE — Therapy (Signed)
[place], [place], [address] Phone: [phone]   Fax:  [phone]  Physical Therapy Treatment  Patient Details  Name: Troy Wilkerson MRN: [id number] Date of Birth: July 02, 1980 Referring Provider: Dr Dianah Field   Encounter Date: 09/30/2017  PT End of Session - 09/30/17 0720    Visit Number  4    Number of Visits  12    Date for PT Re-Evaluation  10/23/17    PT Start Time  4081    PT Stop Time  0814    PT Time Calculation (min)  57 min    Activity Tolerance  Patient tolerated treatment well       Past Medical History:  Diagnosis Date  . Anxiety   . Benign tumor of thoracic site    72 mos old  . Depression   . Obesity     Past Surgical History:  Procedure Laterality Date  . KNEE ARTHROSCOPY     left 2011  . THORACOTOMY     6 mos old    There were no vitals filed for this visit.  Subjective Assessment - 09/30/17 0723    Subjective  Troy Wilkerson reports that he was in urgent care Friday for IV fluids due to dehydration from stomach virus. He has not been able to do his exercises. Symtpoms are about the same. He feels that lying on the exam table for hours Friday did not help his LBP.     Currently in Pain?  Yes    Pain Score  4     Pain Location  Back    Pain Orientation  Left    Pain Descriptors / Indicators  Dull    Pain Type  Chronic pain;Acute pain    Pain Onset  More than a month ago    Pain Frequency  Intermittent         [facility] PT Assessment - 09/30/17 0001      Assessment   Medical Diagnosis  Lt lumbar radiculitis     Referring Provider  Dr Dianah Field    Onset Date/Surgical Date  07/10/17    Hand Dominance  Right    Next MD Visit  6/19    Prior Therapy  none       AROM   Lumbar Flexion  40%    Lumbar Extension  40%    Lumbar - Right Side Bend  70%    Lumbar - Left Side Bend  75%    Lumbar - Right Rotation  40%    Lumbar - Left Rotation  40%      Flexibility    Hamstrings  tight bilat ~ 45-50 deg    Quadriceps  tight bilat    ITB  tight bilat     Piriformis  tight bilat Lt > Rt       Palpation   Spinal mobility  hypomobility with CPA mobs through lumbar spine     Palpation comment  continued muscular tightness through Lt lumbar paraspinals; QL into the Lt piriformis and hip abductors                    [facility] Adult PT Treatment/Exercise - 09/30/17 0001      Lumbar Exercises: Stretches   Passive Hamstring Stretch  Left;Right;30 seconds;3 reps supine with strap     Standing Extension  2 reps 2-3 sec pause     Press Ups  -- 2 sec hold x 10 sec  Quad Stretch  Right;Left;2 reps prone with strap     Piriformis Stretch  Left;Right;3 reps;30 seconds supine travell       Lumbar Exercises: Aerobic   Tread Mill  1.5 to 1.7 mph x 8 min       Lumbar Exercises: Supine   Clam  10 reps;3 seconds holding one LE still moving opposite green TB     Bridge  10 reps;5 seconds core engaged    Other Supine Lumbar Exercises  3 part core 10 sec x 10  encouraging core with all exercises       Lumbar Exercises: Quadruped   Madcat/Old Horse  10 reps    Other Quadruped Lumbar Exercises  sit back to childs pose 30 sec x 1 - some discomfort in knees       Moist Heat Therapy   Number Minutes Moist Heat  20 Minutes    Moist Heat Location  Lumbar Spine      Electrical Stimulation   Electrical Stimulation Location  bilat lumbar to Lt posterior hip/crest of pelvis area     Electrical Stimulation Action  IFC    Electrical Stimulation Parameters  to tolerance    Electrical Stimulation Goals  Pain;Tone      Manual Therapy   Manual therapy comments  pt prone     Joint Mobilization  CPA mobs lumbar spine Grade II/III    Soft tissue mobilization  deep tissue work through the [employer] QL/lateral trunk; piriformis/hip abductor musculature     Myofascial Release  Lt lumbar to posterior hip     Passive ROM  IR/ER Lt hip in prone knee flexed                    PT Long Term Goals - 09/30/17 0720      PT LONG TERM GOAL #1   Title  Improve trunk moblity with patient to demonstrate >/= 75% lumbar ROM in standing 10/29/17    Time  6    Period  Weeks    Status  On-going      PT LONG TERM GOAL #2   Title  Patient will demonstrate proper body mechanics with transfers and transitional movements and demo/verbalize understanding of correct back care techniques 10/29/17    Time  6    Period  Weeks    Status  On-going      PT LONG TERM GOAL #3   Title  Patient reports 75-90% decrease in LBP and Lt LE pain 10/29/17    Time  6    Period  Weeks    Status  On-going      PT LONG TERM GOAL #4   Title  Independent in HEP 10/29/17    Time  6    Period  Weeks    Status  On-going      PT LONG TERM GOAL #5   Title  mprove FOTO to </= 29% limitation 10/29/17    Time  6    Period  Weeks    Status  On-going            Plan - 09/30/17 0721    Clinical Impression Statement  Patient is feeling better following stomach bug last week. He reports no significant changes in the LBP. He has some good days and some bad days. He continues to have palpable tightness through the Lt lumbar and hip area but is hesitant to try the DN. He was unable to add new exercises due  to illness. Will continue to work toward stated goals of therapy with more consistent HEP and continued manual work.     Rehab Potential  Good    PT Frequency  2x / week    PT Duration  6 weeks    PT Treatment/Interventions  Patient/family education;ADLs/Self Care Home Management;Cryotherapy;Electrical Stimulation;Iontophoresis 4mg /ml Dexamethasone;Moist Heat;Ultrasound;Dry needling;Manual techniques;Neuromuscular re-education;Therapeutic activities;Therapeutic exercise    PT Next Visit Plan  review HEP; add core stabilization; manual work v DN for Lt posterior hip musculature; modalities; back education     Consulted and Agree with Plan of Care  Patient       Patient will  benefit from skilled therapeutic intervention in order to improve the following deficits and impairments:  Postural dysfunction, Improper body mechanics, Pain, Increased muscle spasms, Increased fascial restricitons, Decreased range of motion, Decreased mobility  Visit Diagnosis: Acute bilateral low back pain with left-sided sciatica  Stiffness due to immobility  Abnormal posture  Other symptoms and signs involving the musculoskeletal system     Problem List Patient Active Problem List   Diagnosis Date Noted  . Encounter for nonprocreative genetic counseling and testing 09/13/2017  . Regurgitation of food 07/26/2017  . Hypertension goal BP (blood pressure) < 130/80 07/26/2017  . Left lumbar radiculitis 07/24/2017  . Encounter for medication management 06/28/2017  . Habitual snoring 06/28/2017  . Long uvula 06/28/2017  . Excessive daytime sleepiness 06/28/2017  . Weight gain due to medication 05/31/2017  . Class 1 obesity due to excess calories without serious comorbidity in adult 05/31/2017  . Chronic non-seasonal allergic rhinitis 04/22/2017  . Nonintractable episodic headache 04/17/2017  . Epigastric pain 04/11/2017  . Non-intractable cyclical vomiting with nausea 02/28/2017  . Dyspepsia 02/28/2017  . Nausea 02/07/2017  . Paronychia of finger of right hand 02/07/2017  . Anxiety associated with depression 01/16/2017  . Bone lesion 01/10/2017  . Low libido 01/10/2017  . Tennis elbow 12/27/2016  . Knee pain, bilateral 12/27/2016  . Depression, major, single episode, moderate ([place]) 12/27/2016    Renesme Kerrigan Nilda Simmer PT, MPH 09/30/2017, 8:04 AM  [facility] [place], [place], [address] Phone: [phone]   Fax:  [phone]  Name: Troy Wilkerson MRN: [id number] Date of Birth: [birth year]

## 2017-10-01 ENCOUNTER — Encounter: Payer: Self-pay | Admitting: Physician Assistant

## 2017-10-01 DIAGNOSIS — A080 Rotaviral enteritis: Secondary | ICD-10-CM | POA: Insufficient documentation

## 2017-10-01 LAB — STOOL CULTURE
MICRO NUMBER: 90690167
MICRO NUMBER:: 90690165
MICRO NUMBER:: 90690166
SHIGA RESULT: NOT DETECTED
SPECIMEN QUALITY: ADEQUATE
SPECIMEN QUALITY:: ADEQUATE
SPECIMEN QUALITY:: ADEQUATE

## 2017-10-01 LAB — OVA AND PARASITE EXAMINATION
CONCENTRATE RESULT:: NONE SEEN
MICRO NUMBER:: 90687887
SPECIMEN QUALITY:: ADEQUATE
TRICHROME RESULT: NONE SEEN

## 2017-10-01 NOTE — Progress Notes (Signed)
GI panel shows diarrhea is due to Rotavirus Other labs look great This will resolve on its own. Treatment is supportive care/hydration

## 2017-10-02 ENCOUNTER — Encounter: Payer: Self-pay | Admitting: Rehabilitative and Restorative Service Providers"

## 2017-10-02 ENCOUNTER — Ambulatory Visit: Payer: BLUE CROSS/BLUE SHIELD | Admitting: Rehabilitative and Restorative Service Providers"

## 2017-10-02 DIAGNOSIS — M2560 Stiffness of unspecified joint, not elsewhere classified: Secondary | ICD-10-CM

## 2017-10-02 DIAGNOSIS — R293 Abnormal posture: Secondary | ICD-10-CM | POA: Diagnosis not present

## 2017-10-02 DIAGNOSIS — Z7409 Other reduced mobility: Secondary | ICD-10-CM

## 2017-10-02 DIAGNOSIS — M5442 Lumbago with sciatica, left side: Secondary | ICD-10-CM

## 2017-10-02 DIAGNOSIS — R29898 Other symptoms and signs involving the musculoskeletal system: Secondary | ICD-10-CM | POA: Diagnosis not present

## 2017-10-02 NOTE — Therapy (Signed)
[facility], [place], [address] Phone: [phone]   Fax:  [phone]  Physical Therapy Treatment  Patient Details  Name: Troy Wilkerson MRN: [id number] Date of Birth: 1980/11/14 Referring Provider: Dr Dianah Field   Encounter Date: 10/02/2017  PT End of Session - 10/02/17 0813    Visit Number  5    Number of Visits  12    Date for PT Re-Evaluation  10/23/17    PT Start Time  0808    PT Stop Time  0907    PT Time Calculation (min)  59 min    Activity Tolerance  Patient tolerated treatment well       Past Medical History:  Diagnosis Date  . Anxiety   . Benign tumor of thoracic site    81 mos old  . Depression   . Obesity     Past Surgical History:  Procedure Laterality Date  . KNEE ARTHROSCOPY     left 2011  . THORACOTOMY     6 mos old    There were no vitals filed for this visit.  Subjective Assessment - 10/02/17 0814    Subjective  Deryck reports that he is working on his stretches at home. He has less pain today in the Lt LB and some increased pain in the Rt LB which may be related to sleepiing with his 37 yr old last night.    Currently in Pain?  Yes    Pain Score  4  2/10 Lt LB area     Pain Location  Back    Pain Orientation  Right;Left    Pain Descriptors / Indicators  Dull    Pain Type  Chronic pain;Acute pain    Pain Onset  More than a month ago                       [facility] Adult PT Treatment/Exercise - 10/02/17 0001      Lumbar Exercises: Aerobic   Tread Mill  1.9 to 2.1 mph x 5 min       Lumbar Exercises: Supine   Other Supine Lumbar Exercises  3 part core 10 sec x 10  encouraging core with all exercises       Lumbar Exercises: Quadruped   Madcat/Old Horse  10 reps    Other Quadruped Lumbar Exercises  sit back to childs pose 30 sec x 1 - some discomfort in knees       Moist Heat Therapy   Number Minutes Moist Heat  20 Minutes    Moist Heat Location   Lumbar Spine      Electrical Stimulation   Electrical Stimulation Location  bilat lumbar to Lt posterior hip/crest of pelvis area     Electrical Stimulation Action  IFC    Electrical Stimulation Parameters  to tolerance    Electrical Stimulation Goals  Pain;Tone      Manual Therapy   Manual therapy comments  pt prone     Joint Mobilization  CPA mobs lumbar spine Grade II/III    Soft tissue mobilization  deep tissue work through the Lt QL/lateral trunk; piriformis/hip abductor musculature     Myofascial Release  Lt lumbar to posterior hip     Passive ROM  IR/ER Lt hip in prone knee flexed        Trigger Point Dry Needling - 10/02/17 0833    Consent Given?  Yes    Education Handout Provided  Yes    Muscles Treated Upper Body  -- Lt with estim - QL and lumbar paraspinals     Longissimus Response  Palpable increased muscle length           PT Education - 10/02/17 0821    Education provided  Yes    Education Details  DN HEP     Person(s) Educated  Patient    Methods  Explanation    Comprehension  Verbalized understanding          PT Long Term Goals - 09/30/17 0720      PT LONG TERM GOAL #1   Title  Improve trunk moblity with patient to demonstrate >/= 75% lumbar ROM in standing 10/29/17    Time  6    Period  Weeks    Status  On-going      PT LONG TERM GOAL #2   Title  Patient will demonstrate proper body mechanics with transfers and transitional movements and demo/verbalize understanding of correct back care techniques 10/29/17    Time  6    Period  Weeks    Status  On-going      PT LONG TERM GOAL #3   Title  Patient reports 75-90% decrease in LBP and Lt LE pain 10/29/17    Time  6    Period  Weeks    Status  On-going      PT LONG TERM GOAL #4   Title  Independent in HEP 10/29/17    Time  6    Period  Weeks    Status  On-going      PT LONG TERM GOAL #5   Title  mprove FOTO to </= 29% limitation 10/29/17    Time  6    Period  Weeks    Status  On-going             Plan - 10/02/17 0836    Clinical Impression Statement  Tolerated DN Lt lumbar spine musculature well with good release of muscular tightness foloowing exercise; DN; manual work and modalities. Patient is gradually progressing toward rehab goals.     Rehab Potential  Good    PT Frequency  2x / week    PT Duration  6 weeks    PT Treatment/Interventions  Patient/family education;ADLs/Self Care Home Management;Cryotherapy;Electrical Stimulation;Iontophoresis 4mg /ml Dexamethasone;Moist Heat;Ultrasound;Dry needling;Manual techniques;Neuromuscular re-education;Therapeutic activities;Therapeutic exercise    PT Next Visit Plan  assess response to DN; review HEP; continue with core stabilization; manual work Lt posterior hip musculature; modalities; back education     Consulted and Agree with Plan of Care  Patient       Patient will benefit from skilled therapeutic intervention in order to improve the following deficits and impairments:  Postural dysfunction, Improper body mechanics, Pain, Increased muscle spasms, Increased fascial restricitons, Decreased range of motion, Decreased mobility  Visit Diagnosis: Acute bilateral low back pain with left-sided sciatica  Stiffness due to immobility  Abnormal posture  Other symptoms and signs involving the musculoskeletal system     Problem List Patient Active Problem List   Diagnosis Date Noted  . Rotavirus enteritis 10/01/2017  . Encounter for nonprocreative genetic counseling and testing 09/13/2017  . Regurgitation of food 07/26/2017  . Hypertension goal BP (blood pressure) < 130/80 07/26/2017  . Left lumbar radiculitis 07/24/2017  . Encounter for medication management 06/28/2017  . Habitual snoring 06/28/2017  . Long uvula 06/28/2017  . Excessive daytime sleepiness 06/28/2017  . Weight gain due to medication 05/31/2017  .  Class 1 obesity due to excess calories without serious comorbidity in adult 05/31/2017  . Chronic  non-seasonal allergic rhinitis 04/22/2017  . Nonintractable episodic headache 04/17/2017  . Epigastric pain 04/11/2017  . Non-intractable cyclical vomiting with nausea 02/28/2017  . Dyspepsia 02/28/2017  . Nausea 02/07/2017  . Paronychia of finger of right hand 02/07/2017  . Anxiety associated with depression 01/16/2017  . Bone lesion 01/10/2017  . Low libido 01/10/2017  . Tennis elbow 12/27/2016  . Knee pain, bilateral 12/27/2016  . Depression, major, single episode, moderate ([place]) 12/27/2016    Celyn Nilda Simmer PT, MPH  10/02/2017, 8:49 AM  [facility] [place], [place], [address] Phone: [phone]   Fax:  [phone]  Name: Troy Wilkerson MRN: [id number] Date of Birth: 11-25-1980

## 2017-10-02 NOTE — Patient Instructions (Addendum)
Cat / Cow Flow    Inhale, press spine toward ceiling like a Halloween cat. Keeping strength in arms and abdominals, exhale to soften spine through neutral and into cow pose. Open chest and arch back. Initiate movement between cat and cow at tailbone, one vertebrae at a time. Repeat __10__ times.   Side Waist Stretch from Child's Pose    childs pose - from cat arched position sit back onto heels. Reach hips back toward heels. Keep hands forward  . Hold for _30 seconds. Repeat _2-3___ times each side.       Trigger Point Dry Needling  . What is Trigger Point Dry Needling (DN)? o DN is a physical therapy technique used to treat muscle pain and dysfunction. Specifically, DN helps deactivate muscle trigger points (muscle knots).  o A thin filiform needle is used to penetrate the skin and stimulate the underlying trigger point. The goal is for a local twitch response (LTR) to occur and for the trigger point to relax. No medication of any kind is injected during the procedure.   . What Does Trigger Point Dry Needling Feel Like?  o The procedure feels different for each individual patient. Some patients report that they do not actually feel the needle enter the skin and overall the process is not painful. Very mild bleeding may occur. However, many patients feel a deep cramping in the muscle in which the needle was inserted. This is the local twitch response.   Marland Kitchen How Will I feel after the treatment? o Soreness is normal, and the onset of soreness may not occur for a few hours. Typically this soreness does not last longer than two days.  o Bruising is uncommon, however; ice can be used to decrease any possible bruising.  o In rare cases feeling tired or nauseous after the treatment is normal. In addition, your symptoms may get worse before they get better, this period will typically not last longer than 24 hours.   . What Can I do After My Treatment? o Increase your hydration by drinking  more water for the next 24 hours. o You may place ice or heat on the areas treated that have become sore, however, do not use heat on inflamed or bruised areas. Heat often brings more relief post needling. o You can continue your regular activities, but vigorous activity is not recommended initially after the treatment for 24 hours. o DN is best combined with other physical therapy such as strengthening, stretching, and other therapies.

## 2017-10-07 ENCOUNTER — Encounter: Payer: Self-pay | Admitting: Rehabilitative and Restorative Service Providers"

## 2017-10-07 ENCOUNTER — Ambulatory Visit: Payer: BLUE CROSS/BLUE SHIELD | Admitting: Rehabilitative and Restorative Service Providers"

## 2017-10-07 DIAGNOSIS — R29898 Other symptoms and signs involving the musculoskeletal system: Secondary | ICD-10-CM

## 2017-10-07 DIAGNOSIS — M5442 Lumbago with sciatica, left side: Secondary | ICD-10-CM | POA: Diagnosis not present

## 2017-10-07 DIAGNOSIS — M2560 Stiffness of unspecified joint, not elsewhere classified: Secondary | ICD-10-CM | POA: Diagnosis not present

## 2017-10-07 DIAGNOSIS — Z7409 Other reduced mobility: Secondary | ICD-10-CM

## 2017-10-07 DIAGNOSIS — R293 Abnormal posture: Secondary | ICD-10-CM

## 2017-10-07 NOTE — Patient Instructions (Signed)
   Outer Hip Stretch: Reclined IT Band Stretch (Strap)   Strap around one foot, pull leg across body until you feel a pull or stretch in the outside of your hip, with shoulders on mat. Hold for 30 seconds. Repeat 3 times each leg. 2-3 times/day.   Quads / HF, Prone KNEE: Quadriceps - Prone    Place strap around ankle. Bring ankle toward buttocks. Press hip into surface. Hold 30 seconds. Repeat 3 times per session. Do 2-3 sessions per day.

## 2017-10-07 NOTE — Therapy (Signed)
[address] [place], [place], [address] Phone: [phone]   Fax:  [phone]  Physical Therapy Treatment  Patient Details  Name: Troy Wilkerson MRN: [id number] Date of Birth: 1980/12/05 Referring Provider: Dr Dianah Field   Encounter Date: 10/07/2017  PT End of Session - 10/07/17 0813    Visit Number  6    Number of Visits  12    Date for PT Re-Evaluation  10/23/17    PT Start Time  0805    PT Stop Time  0900    PT Time Calculation (min)  55 min    Activity Tolerance  Patient tolerated treatment well       Past Medical History:  Diagnosis Date  . Anxiety   . Benign tumor of thoracic site    7 mos old  . Depression   . Obesity     Past Surgical History:  Procedure Laterality Date  . KNEE ARTHROSCOPY     left 2011  . THORACOTOMY     6 mos old    There were no vitals filed for this visit.  Subjective Assessment - 10/07/17 0813    Subjective  Troy Wilkerson reports tht he did well with the DN and had little soreness. He was at a tractor pull over the weekend and was jumpint down from a wall about 4 feet high. Notes that he is having knee pain today - more than back pain     Currently in Pain?  Yes    Pain Score  2     Pain Location  Back    Pain Orientation  Right;Left    Pain Descriptors / Indicators  Dull    Pain Onset  More than a month ago                       [facility] Adult PT Treatment/Exercise - 10/07/17 0001      Lumbar Exercises: Stretches   Passive Hamstring Stretch  Left;Right;30 seconds;3 reps    Quad Stretch  Right;Left;2 reps prone with strap     ITB Stretch  Right;Left;2 reps;30 seconds supine with strap     Piriformis Stretch  Left;Right;3 reps;30 seconds supine with strap       Lumbar Exercises: Aerobic   Tread Mill  2.1 to 2.3 mph x 5.5 min       Lumbar Exercises: Supine   Other Supine Lumbar Exercises  3 part core 10 sec x 10  encouraging core with all exercises        Moist Heat Therapy   Number Minutes Moist Heat  20 Minutes    Moist Heat Location  Lumbar Spine      Electrical Stimulation   Electrical Stimulation Location  bilat lumbar to Lt posterior hip/crest of pelvis area     Electrical Stimulation Action  IFC    Electrical Stimulation Parameters  to tolerance    Electrical Stimulation Goals  Pain;Tone      Manual Therapy   Manual therapy comments  pt prone     Joint Mobilization  CPA mobs lumbar spine Grade II/III    Soft tissue mobilization  deep tissue work through the Lt QL/lateral trunk; piriformis/hip abductor musculature     Myofascial Release  Lt lumbar to posterior hip     Passive ROM  IR/ER Lt hip in prone knee flexed        Trigger Point Dry Needling - 10/07/17 0832    Consent  Given?  Yes    Muscles Treated Upper Body  -- bilat with estim    Longissimus Response  Palpable increased muscle length    Gluteus Maximus Response  Palpable increased muscle length    Piriformis Response  Palpable increased muscle length           PT Education - 10/07/17 0827    Education provided  Yes    Education Details  HEP    Person(s) Educated  Patient    Methods  Explanation;Demonstration;Tactile cues;Verbal cues;Handout    Comprehension  Verbalized understanding;Returned demonstration;Verbal cues required;Tactile cues required          PT Long Term Goals - 10/07/17 0817      PT LONG TERM GOAL #1   Title  Improve trunk moblity with patient to demonstrate >/= 75% lumbar ROM in standing 10/29/17    Time  6    Period  Weeks    Status  On-going      PT LONG TERM GOAL #2   Title  Patient will demonstrate proper body mechanics with transfers and transitional movements and demo/verbalize understanding of correct back care techniques 10/29/17    Time  6    Period  Weeks    Status  On-going      PT LONG TERM GOAL #3   Title  Patient reports 75-90% decrease in LBP and Lt LE pain 10/29/17    Time  6    Period  Weeks    Status   On-going      PT LONG TERM GOAL #4   Title  Independent in HEP 10/29/17    Time  6    Period  Weeks    Status  On-going      PT LONG TERM GOAL #5   Title  mprove FOTO to </= 29% limitation 10/29/17    Time  6    Period  Weeks    Status  On-going            Plan - 10/07/17 0818    Clinical Impression Statement  Progressing well with decreasing LBP - today he has bilat knee pain from activities at the tractor pull over the weekend. Added stretches for ITB and quads without difficulty. Patient demonstrates progress with LBP. Progressing toward goals of therapy.     Rehab Potential  Good    PT Frequency  2x / week    PT Duration  6 weeks    PT Treatment/Interventions  Patient/family education;ADLs/Self Care Home Management;Cryotherapy;Electrical Stimulation;Iontophoresis 4mg /ml Dexamethasone;Moist Heat;Ultrasound;Dry needling;Manual techniques;Neuromuscular re-education;Therapeutic activities;Therapeutic exercise    PT Next Visit Plan  continue DN; review HEP; continue with core stabilization; manual work Lt posterior hip musculature; modalities; back education     Consulted and Agree with Plan of Care  Patient       Patient will benefit from skilled therapeutic intervention in order to improve the following deficits and impairments:  Postural dysfunction, Improper body mechanics, Pain, Increased muscle spasms, Increased fascial restricitons, Decreased range of motion, Decreased mobility  Visit Diagnosis: Acute bilateral low back pain with left-sided sciatica  Stiffness due to immobility  Abnormal posture  Other symptoms and signs involving the musculoskeletal system     Problem List Patient Active Problem List   Diagnosis Date Noted  . Rotavirus enteritis 10/01/2017  . Encounter for nonprocreative genetic counseling and testing 09/13/2017  . Regurgitation of food 07/26/2017  . Hypertension goal BP (blood pressure) < 130/80 07/26/2017  . Left lumbar radiculitis 07/24/2017   .  Encounter for medication management 06/28/2017  . Habitual snoring 06/28/2017  . Long uvula 06/28/2017  . Excessive daytime sleepiness 06/28/2017  . Weight gain due to medication 05/31/2017  . Class 1 obesity due to excess calories without serious comorbidity in adult 05/31/2017  . Chronic non-seasonal allergic rhinitis 04/22/2017  . Nonintractable episodic headache 04/17/2017  . Epigastric pain 04/11/2017  . Non-intractable cyclical vomiting with nausea 02/28/2017  . Dyspepsia 02/28/2017  . Nausea 02/07/2017  . Paronychia of finger of right hand 02/07/2017  . Anxiety associated with depression 01/16/2017  . Bone lesion 01/10/2017  . Low libido 01/10/2017  . Tennis elbow 12/27/2016  . Knee pain, bilateral 12/27/2016  . Depression, major, single episode, moderate ([place]) 12/27/2016    Celyn Nilda Simmer PT, MPH  10/07/2017, 8:51 AM  [facility] [place] [place], [place], [address] Phone: [phone]   Fax:  [phone]  Name: Troy Wilkerson MRN: [id number] Date of Birth: [birth year]

## 2017-10-09 ENCOUNTER — Encounter: Payer: BLUE CROSS/BLUE SHIELD | Admitting: Rehabilitative and Restorative Service Providers"

## 2017-10-10 ENCOUNTER — Encounter: Payer: Self-pay | Admitting: Rehabilitative and Restorative Service Providers"

## 2017-10-10 ENCOUNTER — Ambulatory Visit: Payer: BLUE CROSS/BLUE SHIELD | Admitting: Rehabilitative and Restorative Service Providers"

## 2017-10-10 DIAGNOSIS — M5442 Lumbago with sciatica, left side: Secondary | ICD-10-CM

## 2017-10-10 DIAGNOSIS — Z7409 Other reduced mobility: Secondary | ICD-10-CM

## 2017-10-10 DIAGNOSIS — R29898 Other symptoms and signs involving the musculoskeletal system: Secondary | ICD-10-CM

## 2017-10-10 DIAGNOSIS — M2560 Stiffness of unspecified joint, not elsewhere classified: Secondary | ICD-10-CM | POA: Diagnosis not present

## 2017-10-10 DIAGNOSIS — R293 Abnormal posture: Secondary | ICD-10-CM | POA: Diagnosis not present

## 2017-10-10 NOTE — Patient Instructions (Signed)
   Resisted External Rotation: in Neutral - Bilateral   PALMS UP Sit or stand, tubing in both hands, elbows at sides, bent to 90, forearms forward. Pinch shoulder blades together and rotate forearms out. Keep elbows at sides. Repeat __10__ times per set. Do _2-3___ sets per session. Do _2-3___ sessions per day.   Low Row: Standing   Face anchor, feet shoulder width apart. Palms up, pull arms back, squeezing shoulder blades together. Repeat 10__ times per set. Do 2-3__ sets per session. Do 2-3__ sessions per week. Anchor Height: Waist     Strengthening: Resisted Extension   Hold tubing in right hand, arm forward. Pull arm back, elbow straight. Repeat _10___ times per set. Do 2-3____ sets per session. Do 2-3____ sessions per day.    Anti-rotation with red tB x 10 each side

## 2017-10-10 NOTE — Therapy (Addendum)
[facility] [address], [place], [address] Phone: [phone]   Fax:  [phone]  Physical Therapy Treatment  Patient Details  Name: Aurelio Mccamy MRN: [id number] Date of Birth: 12-27-1980 Referring Provider: Dr Dianah Field   Encounter Date: 10/10/2017  PT End of Session - 10/10/17 0808    Visit Number  7    Number of Visits  12    Date for PT Re-Evaluation  10/23/17    PT Start Time  0806    PT Stop Time  0901    PT Time Calculation (min)  55 min    Activity Tolerance  Patient tolerated treatment well       Past Medical History:  Diagnosis Date  . Anxiety   . Benign tumor of thoracic site    4 mos old  . Depression   . Obesity     Past Surgical History:  Procedure Laterality Date  . KNEE ARTHROSCOPY     left 2011  . THORACOTOMY     6 mos old    There were no vitals filed for this visit.  Subjective Assessment - 10/10/17 0809    Subjective  The sides of the back feel good. Still having soreness in the center of the LB where we did the dry needling. Knees are still sore but may be a little bit better. He is working on his exercises at home.     Currently in Pain?  Yes    Pain Score  0-No pain    Pain Location  Back    Pain Orientation  Right;Left    Pain Descriptors / Indicators  Sore    Pain Onset  More than a month ago    Pain Frequency  Intermittent    Aggravating Factors   some movements - bending to the side and lifting     Pain Relieving Factors  exercises; time                        [facility] Adult PT Treatment/Exercise - 10/10/17 0001      Lumbar Exercises: Stretches   Passive Hamstring Stretch  Left;Right;30 seconds;3 reps    Quad Stretch  Right;Left;2 reps prone with strap     ITB Stretch  Right;Left;2 reps;30 seconds supine with strap     Piriformis Stretch  Left;Right;3 reps;30 seconds supine with strap       Lumbar Exercises: Aerobic   Tread Mill  2.1 to 2.3  mph x 5.5 min       Lumbar Exercises: Standing   Scapular Retraction  Strengthening;Right;Left;10 reps;Theraband    Theraband Level (Scapular Retraction)  Level 2 (Red)    Row  Strengthening;Right;Left;10 reps;Theraband    Theraband Level (Row)  Level 2 (Red)    Shoulder Extension  Strengthening;Right;Left;10 reps;Theraband    Theraband Level (Shoulder Extension)  Level 2 (Red)    Other Standing Lumbar Exercises  anti-rotation standing both sides red TB x 10 reps       Lumbar Exercises: Supine   Other Supine Lumbar Exercises  3 part core 10 sec x 10  encouraging core with all exercises       Moist Heat Therapy   Number Minutes Moist Heat  20 Minutes    Moist Heat Location  Lumbar Spine      Electrical Stimulation   Electrical Stimulation Location  bilat lumbar to Lt posterior hip/crest of pelvis area     Electrical Stimulation Action  IFC    Electrical Stimulation Parameters  to tolerance    Electrical Stimulation Goals  Pain;Tone      Manual Therapy   Manual therapy comments  pt prone     Joint Mobilization  CPA mobs lumbar spine Grade II/III    Soft tissue mobilization  deep tissue work through the Lt QL/lateral trunk; piriformis/hip abductor musculature     Myofascial Release  Lt lumbar to posterior hip              PT Education - 10/10/17 0836    Education provided  Yes    Education Details  HEP    Person(s) Educated  Patient    Methods  Explanation;Demonstration;Tactile cues;Verbal cues;Handout    Comprehension  Verbalized understanding;Returned demonstration;Verbal cues required;Tactile cues required          PT Long Term Goals - 10/07/17 0817      PT LONG TERM GOAL #1   Title  Improve trunk moblity with patient to demonstrate >/= 75% lumbar ROM in standing 10/29/17    Time  6    Period  Weeks    Status  On-going      PT LONG TERM GOAL #2   Title  Patient will demonstrate proper body mechanics with transfers and transitional movements and demo/verbalize  understanding of correct back care techniques 10/29/17    Time  6    Period  Weeks    Status  On-going      PT LONG TERM GOAL #3   Title  Patient reports 75-90% decrease in LBP and Lt LE pain 10/29/17    Time  6    Period  Weeks    Status  On-going      PT LONG TERM GOAL #4   Title  Independent in HEP 10/29/17    Time  6    Period  Weeks    Status  On-going      PT LONG TERM GOAL #5   Title  mprove FOTO to </= 29% limitation 10/29/17    Time  6    Period  Weeks    Status  On-going            Plan - 10/10/17 0859    Clinical Impression Statement  Some discomfort in the center of the LB. Discussed possible irritation with work activities. Patient will look at his tasks to determine if there are activities to change. Patient added exercises for core stabilization in standing - protecting knees due to continued soreness from activities of the past weekend. Progressing well toward goals.     Rehab Potential  Good    PT Frequency  2x / week    PT Duration  6 weeks    PT Treatment/Interventions  Patient/family education;ADLs/Self Care Home Management;Cryotherapy;Electrical Stimulation;Iontophoresis 20m/ml Dexamethasone;Moist Heat;Ultrasound;Dry needling;Manual techniques;Neuromuscular re-education;Therapeutic activities;Therapeutic exercise    PT Next Visit Plan  continue DN; review HEP; continue with core stabilization; manual work Lt posterior hip musculature; modalities; back education     Consulted and Agree with Plan of Care  Patient       Patient will benefit from skilled therapeutic intervention in order to improve the following deficits and impairments:  Postural dysfunction, Improper body mechanics, Pain, Increased muscle spasms, Increased fascial restricitons, Decreased range of motion, Decreased mobility  Visit Diagnosis: Acute bilateral low back pain with left-sided sciatica  Stiffness due to immobility  Abnormal posture  Other symptoms and signs involving the  musculoskeletal system  Problem List Patient Active Problem List   Diagnosis Date Noted  . Rotavirus enteritis 10/01/2017  . Encounter for nonprocreative genetic counseling and testing 09/13/2017  . Regurgitation of food 07/26/2017  . Hypertension goal BP (blood pressure) < 130/80 07/26/2017  . Left lumbar radiculitis 07/24/2017  . Encounter for medication management 06/28/2017  . Habitual snoring 06/28/2017  . Long uvula 06/28/2017  . Excessive daytime sleepiness 06/28/2017  . Weight gain due to medication 05/31/2017  . Class 1 obesity due to excess calories without serious comorbidity in adult 05/31/2017  . Chronic non-seasonal allergic rhinitis 04/22/2017  . Nonintractable episodic headache 04/17/2017  . Epigastric pain 04/11/2017  . Non-intractable cyclical vomiting with nausea 02/28/2017  . Dyspepsia 02/28/2017  . Nausea 02/07/2017  . Paronychia of finger of right hand 02/07/2017  . Anxiety associated with depression 01/16/2017  . Bone lesion 01/10/2017  . Low libido 01/10/2017  . Tennis elbow 12/27/2016  . Knee pain, bilateral 12/27/2016  . Depression, major, single episode, moderate ([place]) 12/27/2016    Tania Steinhauser Nilda Simmer PT, MPH 10/10/2017, 9:05 AM  [facility] [address] [place] [place], [place], [address] Phone: [phone]   Fax:  [phone]  Name: Masaichi Kracht MRN: [id number] Date of Birth: Oct 09, 1980  PHYSICAL THERAPY DISCHARGE SUMMARY  Visits from Start of Care: 7  Current functional level related to goals / functional outcomes: See last progress note for discharge status    Remaining deficits: Unknown    Education / Equipment: HEP  Plan: Patient agrees to discharge.  Patient goals were partially met. Patient is being discharged due to not returning since the last visit.  ?????     Kori Colin P. Helene Kelp PT, MPH 11/21/17 2:48 PM

## 2017-10-14 ENCOUNTER — Encounter: Payer: Self-pay | Admitting: Sports Medicine

## 2017-10-14 ENCOUNTER — Ambulatory Visit: Payer: BLUE CROSS/BLUE SHIELD | Admitting: Sports Medicine

## 2017-10-14 DIAGNOSIS — M5416 Radiculopathy, lumbar region: Secondary | ICD-10-CM

## 2017-10-14 NOTE — Assessment & Plan Note (Signed)
Left L3 distribution radiculitis with some hip joint pathology. Today's pain is more referrable to the left sacroiliac joint, worsened after dry needling. SI joint injection today, return in 1 month, MR for interventional planning in the lumbar spine if no better.

## 2017-10-14 NOTE — Progress Notes (Signed)
Subjective:    CC: Recheck back pain  HPI: Troy Wilkerson is a pleasant 37 year old male with low back pain, lumbar degenerative disc disease on x-rays.  Some left L3 radiculitis.  Overall he was doing well with regards to his lumbar disc pain, after a session of dry needling he had a worsening of pain localized over the left SI joint.  Moderate, persistent, localized with radiation to the posterior lateral thigh.  I reviewed the past medical history, family history, social history, surgical history, and allergies today and no changes were needed.  Please see the problem list section below in epic for further details.  Past Medical History: Past Medical History:  Diagnosis Date  . Anxiety   . Benign tumor of thoracic site    49 mos old  . Depression   . Obesity    Past Surgical History: Past Surgical History:  Procedure Laterality Date  . KNEE ARTHROSCOPY     left 2011  . THORACOTOMY     80 mos old   Social History: Social History   Socioeconomic History  . Marital status: Married    Spouse name: Not on file  . Number of children: Not on file  . Years of education: Not on file  . Highest education level: Not on file  Occupational History  . Not on file  Social Needs  . Financial resource strain: Not on file  . Food insecurity:    Worry: Not on file    Inability: Not on file  . Transportation needs:    Medical: Not on file    Non-medical: Not on file  Tobacco Use  . Smoking status: Never Smoker  . Smokeless tobacco: Never Used  Substance and Sexual Activity  . Alcohol use: No  . Drug use: No  . Sexual activity: Yes    Partners: Female    Birth control/protection: None  Lifestyle  . Physical activity:    Days per week: Not on file    Minutes per session: Not on file  . Stress: Not on file  Relationships  . Social connections:    Talks on phone: Not on file    Gets together: Not on file    Attends religious service: Not on file    Active member of club or  organization: Not on file    Attends meetings of clubs or organizations: Not on file    Relationship status: Not on file  Other Topics Concern  . Not on file  Social History Narrative  . Not on file   Family History: Family History  Problem Relation Age of Onset  . Cancer Mother   . Heart disease Mother   . Diabetes Mother   . Stroke Brother    Allergies: Allergies  Allergen Reactions  . Cymbalta [Duloxetine Hcl] Nausea And Vomiting    GeneSight: significant gene-drug interaction  . Luvox [Fluvoxamine] Other (See Comments)    GeneSight: significant gene-drug interaction   Medications: See med rec.  Review of Systems: No fevers, chills, night sweats, weight loss, chest pain, or shortness of breath.   Objective:    General: Well Developed, well nourished, and in no acute distress.  Neuro: Alert and oriented x3, extra-ocular muscles intact, sensation grossly intact.  HEENT: Normocephalic, atraumatic, pupils equal round reactive to light, neck supple, no masses, no lymphadenopathy, thyroid nonpalpable.  Skin: Warm and dry, no rashes. Cardiac: Regular rate and rhythm, no murmurs rubs or gallops, no lower extremity edema.  Respiratory: Clear to auscultation bilaterally.  Not using accessory muscles, speaking in full sentences. Back Exam:  Inspection: Unremarkable  Motion: Flexion 45 deg, Extension 45 deg, Side Bending to 45 deg bilaterally,  Rotation to 45 deg bilaterally  SLR laying: Negative  XSLR laying: Negative  Palpable tenderness: Left sacroiliac joint. FABER: Positive Patrick's test on the left. Sensory change: Gross sensation intact to all lumbar and sacral dermatomes.  Reflexes: 2+ at both patellar tendons, 2+ at achilles tendons, Babinski's downgoing.  Strength at foot  Plantar-flexion: 5/5 Dorsi-flexion: 5/5 Eversion: 5/5 Inversion: 5/5  Leg strength  Quad: 5/5 Hamstring: 5/5 Hip flexor: 5/5 Hip abductors: 5/5  Gait unremarkable.  Procedure: Real-time  Ultrasound Guided Injection of left SI joint Device: GE Logiq E  Verbal informed consent obtained.  Time-out conducted.  Noted no overlying erythema, induration, or other signs of local infection.  Skin prepped in a sterile fashion.  Local anesthesia: Topical Ethyl chloride.  With sterile technique and under real time ultrasound guidance: 22-gauge spinal needle advanced into the sacroiliac joint taking care to avoid the S1 foramen, I then injected 1 cc kenalog 40, 2 cc lidocaine, 2 cc bupivacaine easily. Completed without difficulty  Pain immediately resolved suggesting accurate placement of the medication.  Advised to call if fevers/chills, erythema, induration, drainage, or persistent bleeding.  Images permanently stored and available for review in the ultrasound unit.  Impression: Technically successful ultrasound guided injection.  Impression and Recommendations:    Left lumbar radiculitis Left L3 distribution radiculitis with some hip joint pathology. Today's pain is more referrable to the left sacroiliac joint, worsened after dry needling. SI joint injection today, return in 1 month, MR for interventional planning in the lumbar spine if no better.  ___________________________________________ Gwen Her. Dianah Field, M.D., ABFM., CAQSM. Primary Care and [place] Instructor of [place] [employer] [facility] [employer]

## 2017-10-21 ENCOUNTER — Encounter: Payer: BLUE CROSS/BLUE SHIELD | Admitting: Rehabilitative and Restorative Service Providers"

## 2017-11-11 ENCOUNTER — Ambulatory Visit: Payer: BLUE CROSS/BLUE SHIELD | Admitting: Sports Medicine

## 2017-11-11 ENCOUNTER — Encounter: Payer: Self-pay | Admitting: Sports Medicine

## 2017-11-11 DIAGNOSIS — M25561 Pain in right knee: Secondary | ICD-10-CM

## 2017-11-11 DIAGNOSIS — G8929 Other chronic pain: Secondary | ICD-10-CM | POA: Diagnosis not present

## 2017-11-11 DIAGNOSIS — M25562 Pain in left knee: Secondary | ICD-10-CM

## 2017-11-11 DIAGNOSIS — M5416 Radiculopathy, lumbar region: Secondary | ICD-10-CM

## 2017-11-11 MED ORDER — NAPROXEN 500 MG PO TABS: 500.0000 mg | ORAL_TABLET | Freq: Two times a day (BID) | ORAL | 3 refills | Status: AC

## 2017-11-11 NOTE — Assessment & Plan Note (Signed)
Chronic left knee pain, pain is just distal to the joint line, he certainly does have osteoarthritis on x-rays and some joint line pain. But considering the localization of his pain I am more concerned for pes bursitis versus a subchondral insufficiency fracture. MRI knee before further intervention. Naproxen 500 as needed for pain in the meantime.

## 2017-11-11 NOTE — Assessment & Plan Note (Signed)
Clinical left L3 distribution radiculitis, SI joint injection at the last visit did really not provide much relief at all. MRI of the lumbar spine for interventional planning, has failed greater than 6 weeks of physical therapy, NSAIDs, injections. Return for epidural planning.

## 2017-11-11 NOTE — Progress Notes (Signed)
Subjective:    CC: Follow-up  HPI: Low back pain: Axial, left-sided, occasional radiation down the anterior left thigh to the inside of the left knee.  We did an SI joint injection on the left of the last visit but no relief.  No bowel or bladder dysfunction, saddle numbness, constitutional symptoms.  Left knee pain: Mild arthritis on x-rays, recently with a worsening in pain with moderate mechanical symptoms, pain is just distal to the medial joint line.  I reviewed the past medical history, family history, social history, surgical history, and allergies today and no changes were needed.  Please see the problem list section below in epic for further details.  Past Medical History: Past Medical History:  Diagnosis Date  . Anxiety   . Benign tumor of thoracic site    32 mos old  . Depression   . Obesity    Past Surgical History: Past Surgical History:  Procedure Laterality Date  . KNEE ARTHROSCOPY     left 2011  . THORACOTOMY     19 mos old   Social History: Social History   Socioeconomic History  . Marital status: Married    Spouse name: Not on file  . Number of children: Not on file  . Years of education: Not on file  . Highest education level: Not on file  Occupational History  . Not on file  Social Needs  . Financial resource strain: Not on file  . Food insecurity:    Worry: Not on file    Inability: Not on file  . Transportation needs:    Medical: Not on file    Non-medical: Not on file  Tobacco Use  . Smoking status: Never Smoker  . Smokeless tobacco: Never Used  Substance and Sexual Activity  . Alcohol use: No  . Drug use: No  . Sexual activity: Yes    Partners: Female    Birth control/protection: None  Lifestyle  . Physical activity:    Days per week: Not on file    Minutes per session: Not on file  . Stress: Not on file  Relationships  . Social connections:    Talks on phone: Not on file    Gets together: Not on file    Attends religious  service: Not on file    Active member of club or organization: Not on file    Attends meetings of clubs or organizations: Not on file    Relationship status: Not on file  Other Topics Concern  . Not on file  Social History Narrative  . Not on file   Family History: Family History  Problem Relation Age of Onset  . Cancer Mother   . Heart disease Mother   . Diabetes Mother   . Stroke Brother    Allergies: Allergies  Allergen Reactions  . Cymbalta [Duloxetine Hcl] Nausea And Vomiting    GeneSight: significant gene-drug interaction  . Luvox [Fluvoxamine] Other (See Comments)    GeneSight: significant gene-drug interaction   Medications: See med rec.  Review of Systems: No fevers, chills, night sweats, weight loss, chest pain, or shortness of breath.   Objective:    General: Well Developed, well nourished, and in no acute distress.  Neuro: Alert and oriented x3, extra-ocular muscles intact, sensation grossly intact.  HEENT: Normocephalic, atraumatic, pupils equal round reactive to light, neck supple, no masses, no lymphadenopathy, thyroid nonpalpable.  Skin: Warm and dry, no rashes. Cardiac: Regular rate and rhythm, no murmurs rubs or gallops, no lower  extremity edema.  Respiratory: Clear to auscultation bilaterally. Not using accessory muscles, speaking in full sentences. Left knee: Normal to inspection with no erythema or effusion or obvious bony abnormalities. Tender to palpation at the medial joint line, worse tenderness over the pes anserine bursa as well as the medial tibial plateau ROM normal in flexion and extension and lower leg rotation. Ligaments with solid consistent endpoints including ACL, PCL, LCL, MCL. Negative Mcmurray's and provocative meniscal tests. Non painful patellar compression. Patellar and quadriceps tendons unremarkable. Hamstring and quadriceps strength is normal.  Impression and Recommendations:    Left lumbar radiculitis Clinical left L3  distribution radiculitis, SI joint injection at the last visit did really not provide much relief at all. MRI of the lumbar spine for interventional planning, has failed greater than 6 weeks of physical therapy, NSAIDs, injections. Return for epidural planning.  Knee pain, bilateral Chronic left knee pain, pain is just distal to the joint line, he certainly does have osteoarthritis on x-rays and some joint line pain. But considering the localization of his pain I am more concerned for pes bursitis versus a subchondral insufficiency fracture. MRI knee before further intervention. Naproxen 500 as needed for pain in the meantime. ___________________________________________ Gwen Her. Dianah Field, M.D., ABFM., CAQSM. Primary Care and [place] Instructor of [place] [employer] [facility] [employer]

## 2017-11-19 ENCOUNTER — Other Ambulatory Visit: Payer: Self-pay

## 2017-11-19 ENCOUNTER — Encounter: Payer: Self-pay | Admitting: Family Medicine

## 2017-11-19 ENCOUNTER — Ambulatory Visit: Payer: BLUE CROSS/BLUE SHIELD | Admitting: Family Medicine

## 2017-11-19 ENCOUNTER — Emergency Department (INDEPENDENT_AMBULATORY_CARE_PROVIDER_SITE_OTHER)
Admission: EM | Admit: 2017-11-19 | Discharge: 2017-11-19 | Disposition: A | Discharge status: Home / Self Care | Payer: BLUE CROSS/BLUE SHIELD | Source: Home / Self Care

## 2017-11-19 VITALS — BP 129/69 | HR 87 | Wt 245.0 lb

## 2017-11-19 DIAGNOSIS — M5416 Radiculopathy, lumbar region: Secondary | ICD-10-CM

## 2017-11-19 DIAGNOSIS — M545 Low back pain: Secondary | ICD-10-CM

## 2017-11-19 DIAGNOSIS — S39012A Strain of muscle, fascia and tendon of lower back, initial encounter: Secondary | ICD-10-CM

## 2017-11-19 MED ORDER — KETOROLAC TROMETHAMINE 60 MG/2ML IM SOLN
60.0000 mg | Freq: Once | INTRAMUSCULAR | Status: AC
  Administered 2017-11-19: 60 mg via INTRAMUSCULAR

## 2017-11-19 MED ORDER — METHOCARBAMOL 500 MG PO TABS
500.0000 mg | ORAL_TABLET | Freq: Two times a day (BID) | ORAL | 0 refills | Status: DC
Start: 2017-11-19 — End: 2018-12-22

## 2017-11-19 MED ORDER — METHYLPREDNISOLONE SODIUM SUCC 125 MG IJ SOLR
125.0000 mg | Freq: Once | INTRAMUSCULAR | Status: AC
  Administered 2017-11-19: 125 mg via INTRAMUSCULAR

## 2017-11-19 MED ORDER — PREDNISONE 10 MG PO TABS: ORAL_TABLET | ORAL | 0 refills | Status: DC

## 2017-11-19 NOTE — ED Provider Notes (Signed)
[facility]    CSN: [id number] Arrival date & time: 11/19/17  0806     History   Chief Complaint Chief Complaint  Patient presents with  . Back Pain    HPI Troy Wilkerson is a 37 y.o. male.   The history is provided by the patient. No language interpreter was used.  Back Pain  Location:  Generalized Quality:  Aching Radiates to:  Does not radiate Pain severity:  Moderate Pain is:  Same all the time Onset quality:  Gradual Duration:  1 week Timing:  Constant Progression:  Worsening Chronicity:  New Relieved by:  Nothing Worsened by:  Nothing Ineffective treatments:  None tried Associated symptoms: no numbness, no paresthesias and no weakness     Past Medical History:  Diagnosis Date  . Anxiety   . Benign tumor of thoracic site    49 mos old  . Depression   . Obesity     Patient Active Problem List   Diagnosis Date Noted  . Rotavirus enteritis 10/01/2017  . Encounter for nonprocreative genetic counseling and testing 09/13/2017  . Regurgitation of food 07/26/2017  . Hypertension goal BP (blood pressure) < 130/80 07/26/2017  . Left lumbar radiculitis 07/24/2017  . Encounter for medication management 06/28/2017  . Habitual snoring 06/28/2017  . Long uvula 06/28/2017  . Excessive daytime sleepiness 06/28/2017  . Weight gain due to medication 05/31/2017  . Class 1 obesity due to excess calories without serious comorbidity in adult 05/31/2017  . Chronic non-seasonal allergic rhinitis 04/22/2017  . Nonintractable episodic headache 04/17/2017  . Epigastric pain 04/11/2017  . Non-intractable cyclical vomiting with nausea 02/28/2017  . Dyspepsia 02/28/2017  . Nausea 02/07/2017  . Paronychia of finger of right hand 02/07/2017  . Anxiety associated with depression 01/16/2017  . Bone lesion 01/10/2017  . Low libido 01/10/2017  . Tennis elbow 12/27/2016  . Knee pain, bilateral 12/27/2016  . Depression, major, single episode, moderate ([place]) 12/27/2016     Past Surgical History:  Procedure Laterality Date  . KNEE ARTHROSCOPY     left 2011  . THORACOTOMY     6 mos old       Home Medications    Prior to Admission medications   Medication Sig Start Date End Date Taking? Authorizing Provider  diphenoxylate-atropine (LOMOTIL) 2.5-0.025 MG tablet Take 1 tablet by mouth 4 (four) times daily as needed for diarrhea or loose stools. Patient not taking: Reported on 11/11/2017 09/27/17   Trixie Dredge, PA-C  fexofenadine-pseudoephedrine [facility]) 180-240 MG 24 hr tablet Take 1 tablet by mouth daily. Patient not taking: Reported on 11/11/2017 09/13/17   Trixie Dredge, PA-C  fluticasone [facility]) 50 MCG/ACT nasal spray Place 1 spray into both nostrils daily. Patient not taking: Reported on 11/11/2017 04/22/17   Trixie Dredge, PA-C  meloxicam (MOBIC) 15 MG tablet One tab PO qAM with breakfast for 2 weeks, then daily prn pain. 07/24/17   Silverio Decamp, MD  metoCLOPramide (REGLAN) 5 MG tablet Take 1 tablet (5 mg total) by mouth every 6 (six) hours as needed for nausea or vomiting. 09/25/17   Trixie Dredge, PA-C  naproxen (NAPROSYN) 500 MG tablet Take 1 tablet (500 mg total) by mouth 2 (two) times daily with a meal. 11/11/17 11/11/18  Silverio Decamp, MD  nortriptyline (PAMELOR) 50 MG capsule Take 1 capsule (50 mg total) by mouth at bedtime. 06/28/17   Trixie Dredge, PA-C  omeprazole (PRILOSEC) 40 MG capsule Take 1  capsule (40 mg total) by mouth daily. 07/26/17   Trixie Dredge, PA-C  promethazine (PHENERGAN) 25 MG tablet Take 1 tablet (25 mg total) by mouth at bedtime as needed for nausea or vomiting. 09/23/17   Trixie Dredge, PA-C    Family History Family History  Problem Relation Age of Onset  . Cancer Mother   . Heart disease Mother   . Diabetes Mother   . Stroke Brother     Social History Social History   Tobacco Use   . Smoking status: Never Smoker  . Smokeless tobacco: Never Used  Substance Use Topics  . Alcohol use: No  . Drug use: No     Allergies   Cymbalta [duloxetine hcl] and Luvox [fluvoxamine]   Review of Systems Review of Systems  Musculoskeletal: Positive for back pain.  Neurological: Negative for weakness, numbness and paresthesias.  All other systems reviewed and are negative.    Physical Exam Triage Vital Signs ED Triage Vitals  Enc Vitals Group     BP 11/19/17 0825 135/80     Pulse Rate 11/19/17 0825 61     Resp --      Temp 11/19/17 0825 97.7 F (36.5 C)     Temp Source 11/19/17 0825 Oral     SpO2 11/19/17 0825 99 %     Weight 11/19/17 0826 243 lb (110.2 kg)     Height 11/19/17 0826 6' (1.829 m)     Head Circumference --      Peak Flow --      Pain Score 11/19/17 0826 8     Pain Loc --      Pain Edu? --      Excl. in [place]? --    No data found.  Updated Vital Signs BP 135/80 (BP Location: Right Arm)   Pulse 61   Temp 97.7 F (36.5 C) (Oral)   Ht 6' (1.829 m)   Wt 243 lb (110.2 kg)   SpO2 99%   BMI 32.96 kg/m   Visual Acuity Right Eye Distance:   Left Eye Distance:   Bilateral Distance:    Right Eye Near:   Left Eye Near:    Bilateral Near:     Physical Exam  Constitutional: He appears well-developed and well-nourished.  HENT:  Head: Normocephalic and atraumatic.  Eyes: Conjunctivae are normal.  Neck: Neck supple.  Cardiovascular: Normal rate and regular rhythm.  No murmur heard. Pulmonary/Chest: Effort normal and breath sounds normal. No respiratory distress.  Abdominal: There is no tenderness.  Musculoskeletal: He exhibits tenderness. He exhibits no edema.  Diffusely tender left low back.  Pain with movement   Neurological: He is alert.  Skin: Skin is warm and dry.  Psychiatric: He has a normal mood and affect.  Nursing note and vitals reviewed.    UC Treatments / Results  Labs (all labs ordered are listed, but only abnormal results  are displayed) Labs Reviewed - No data to display  EKG None  Radiology No results found.  Procedures Procedures (including critical care time)  Medications Ordered in UC Medications  ketorolac (TORADOL) injection 60 mg (has no administration in time range)  methylPREDNISolone sodium succinate (SOLU-MEDROL) 125 mg/2 mL injection 125 mg (has no administration in time range)    Initial Impression / Assessment and Plan / UC Course  I have reviewed the triage vital signs and the nursing notes.  Pertinent labs & imaging results that were available during my care of the patient were reviewed  by me and considered in my medical decision making (see chart for details).      Final Clinical Impressions(s) / UC Diagnoses   Final diagnoses:  Acute low back pain, unspecified back pain laterality, with sciatica presence unspecified   Discharge Instructions   None    ED Prescriptions    Medication Sig Dispense Auth. Provider   methocarbamol (ROBAXIN) 500 MG tablet Take 1 tablet (500 mg total) by mouth 2 (two) times daily. 20 tablet Merrill Deanda K, PA-C   predniSONE (DELTASONE) 10 MG tablet 6,5,4,3,2,1 taper 21 tablet Fransico Meadow, [place]     Controlled Substance Prescriptions [place] Controlled Substance Registry consulted? Not Applicable   Fransico Meadow, [place] 11/19/17 [id number]

## 2017-11-19 NOTE — ED Triage Notes (Addendum)
Pt c/o lower back pain since Feb. Is seeing Dr T for it. Was given injection with [place], with MRI as next step. Tried to call Dr Mcneil Sober office to get appt but couldn't get in until this afternoon. Was advised to come to UC if he needed. Pain level 8/10

## 2017-11-19 NOTE — Discharge Instructions (Addendum)
See Dr. Darene Lamer as scheduled.

## 2017-11-19 NOTE — Progress Notes (Signed)
Troy Wilkerson is a 37 y.o. male who presents to [facility] today for back pain.  Patient has been seen by my partner Dr. Darene Lamer several times recently for episodes of back pain.  He has had work-up including x-rays and trials of treatment including exercise program, SI joint injection and oral medications.  He effectively failed conservative management and was seen on the 22nd.  At that time he was thought to have possible left L3 lumbar radicular pain in addition to his axial back pain and MRI was ordered which will be done next week.  He notes that his pain worsened recently without injury.  He notes severe onset of low back pain with some pain radiating down his left thigh.  The pain is consistent with his previous pain but much worse.  He denies any recent injury or change in activity.  He was seen in urgent care today where he was given a Toradol shot Solu-Medrol shot oral prednisone prescription and methocarbamol prescription.  He notes this helped quite a bit but he continues to experience pain.  He denies any weakness or numbness.   ROS:  As above  Exam:  BP 129/69   Pulse 87   Wt 245 lb (111.1 kg)   BMI 33.23 kg/m  General: Well Developed, well nourished, and in no acute distress.  Neuro/Psych: Alert and oriented x3, extra-ocular muscles intact, able to move all 4 extremities, sensation grossly intact. Skin: Warm and dry, no rashes noted.  Respiratory: Not using accessory muscles, speaking in full sentences, trachea midline.  Cardiovascular: Pulses palpable, no extremity edema. Abdomen: Does not appear distended. MSK:  L-spine nontender to midline.  Tender to palpation left lumbar paraspinal muscle group. Lumbar motion significantly limited by pain. Lower extreme strength equal normal throughout. Negative slump test bilaterally. Reflexes and sensation are equal normal throughout. Antalgic gait present.    Lab and Radiology  Results EXAM: LUMBAR SPINE - COMPLETE 4+ VIEW  COMPARISON:  Left hip and pelvis series today reported separately.  FINDINGS: Normal lumbar segmentation. Bone mineralization is within normal limits. Straightening of lumbar lordosis. Mild lower lumbar facet hypertrophy. No pars fractures. Mild asymmetric sclerosis along the right lower SI joint. The left SI joint appears normal. The sacrum appears intact.  There are chronic posttraumatic or degenerative changes along the L4 anterior superior endplate which is mildly compressed and demonstrates dystrophic calcification and spurring. Similar but less pronounced endplate irregularity at T12. Relatively preserved disc spaces.  IMPRESSION: 1. No acute osseous abnormality identified in the lumbar spine. Chronic appearing posttraumatic and/or degenerative changes to the L4 anterior superior endplate. 2. Asymmetric sclerosis along the right inferior SI joint suspicious for right sacroiliitis. The left SI joint appears normal.   Electronically Signed   By: Genevie Ann M.D.   On: 07/24/2017 14:23 I personally (independently) visualized and performed the interpretation of the images attached in this note.     Assessment and Plan: 37 y.o. male with  Exacerbation of subacute or becoming chronic left low back pain with pain radiating to the left leg consistent with L3 or L2 dermatome.  Agree with MRI.  Agree with current medical treatment.  Discussed possibility of narcotics for pain control.  Patient would like to wait on that and let me know if he would like me to call cement tomorrow.  Obtain MRI and follow-up after MRI.  Okay to complete prednisone course.  New issue for me today.  Historical information moved to  improve visibility of documentation.  Past Medical History:  Diagnosis Date  . Anxiety   . Benign tumor of thoracic site    5 mos old  . Depression   . Obesity    Past Surgical History:  Procedure Laterality Date   . KNEE ARTHROSCOPY     left 2011  . THORACOTOMY     41 mos old   Social History   Tobacco Use  . Smoking status: Never Smoker  . Smokeless tobacco: Never Used  Substance Use Topics  . Alcohol use: No   family history includes Cancer in his mother; Diabetes in his mother; Heart disease in his mother; Stroke in his brother.  Medications: Current Outpatient Medications  Medication Sig Dispense Refill  . diphenoxylate-atropine (LOMOTIL) 2.5-0.025 MG tablet Take 1 tablet by mouth 4 (four) times daily as needed for diarrhea or loose stools. 16 tablet 0  . fexofenadine-pseudoephedrine ([facility]ER[facility] CONGESTION) 180-240 MG 24 hr tablet Take 1 tablet by mouth daily.    . fluticasone (FLONASE) 50 MCG/ACT nasal spray Place 1 spray into both nostrils daily. 16 g 1  . meloxicam (MOBIC) 15 MG tablet One tab PO qAM with breakfast for 2 weeks, then daily prn pain. 30 tablet 3  . methocarbamol (ROBAXIN) 500 MG tablet Take 1 tablet (500 mg total) by mouth 2 (two) times daily. 20 tablet 0  . metoCLOPramide (REGLAN) 5 MG tablet Take 1 tablet (5 mg total) by mouth every 6 (six) hours as needed for nausea or vomiting. 20 tablet 0  . naproxen (NAPROSYN) 500 MG tablet Take 1 tablet (500 mg total) by mouth 2 (two) times daily with a meal. 60 tablet 3  . nortriptyline (PAMELOR) 50 MG capsule Take 1 capsule (50 mg total) by mouth at bedtime. 30 capsule 5  . omeprazole (PRILOSEC) 40 MG capsule Take 1 capsule (40 mg total) by mouth daily. 30 capsule 3  . promethazine (PHENERGAN) 25 MG tablet Take 1 tablet (25 mg total) by mouth at bedtime as needed for nausea or vomiting. 30 tablet 2   No current facility-administered medications for this visit.    Allergies  Allergen Reactions  . Cymbalta [Duloxetine Hcl] Nausea And Vomiting    GeneSight: significant gene-drug interaction  . Luvox [Fluvoxamine] Other (See Comments)    GeneSight: significant gene-drug interaction      Discussed warning signs or  symptoms. Please see discharge instructions. Patient expresses understanding.

## 2017-11-19 NOTE — Patient Instructions (Signed)
Thank you for coming in today. Take the prednisone and muscle relaxors as needed.  If miserable let me know tomorrow and I will send in stronger pain medicine.  Route the call to triage.  MRI Monday.  Come back or go to the emergency room if you notice new weakness new numbness problems walking or bowel or bladder problems.

## 2017-11-21 ENCOUNTER — Telehealth: Payer: Self-pay

## 2017-11-21 NOTE — Telephone Encounter (Signed)
Pt left msg on Triage stating that he was evaluated by Dr Georgina Snell on 11-19-17. Pt was given some medications but states they are not managing his pain. He was told to call back and that Dr Georgina Snell had an alternate med he could call in if needed  Last office note reads:  "Take the prednisone and muscle relaxors as needed.  If miserable let me know tomorrow and I will send in stronger pain medicine.  Route the call to triage. "   Please advise

## 2017-11-22 ENCOUNTER — Telehealth: Payer: Self-pay

## 2017-11-22 MED ORDER — HYDROCODONE-ACETAMINOPHEN 5-325 MG PO TABS: 1.0000 | ORAL_TABLET | Freq: Four times a day (QID) | ORAL | 0 refills | Status: DC | PRN

## 2017-11-22 NOTE — Telephone Encounter (Signed)
Norco prescribed. Please inform pt

## 2017-11-22 NOTE — Addendum Note (Signed)
Addended by: Gregor Hams on: 11/22/2017 01:36 PM   Modules accepted: Orders

## 2017-11-22 NOTE — Addendum Note (Signed)
Addended by: Narda Rutherford on: 11/22/2017 09:17 AM   Modules accepted: Orders

## 2017-11-22 NOTE — Telephone Encounter (Signed)
Pt advised.

## 2017-11-22 NOTE — Addendum Note (Signed)
Addended by: Gregor Hams on: 11/22/2017 12:46 PM   Modules accepted: Orders

## 2017-11-22 NOTE — Telephone Encounter (Signed)
Opened in error. See other phone  note 

## 2017-11-22 NOTE — Telephone Encounter (Signed)
Sencere called back because he is out of town in [place]. He will not be back in town until Sunday. He would like the medication sent to the [facility] in [place]. Please advise.

## 2017-11-22 NOTE — Telephone Encounter (Signed)
Medications sent to [facility] in [place].  Please contact [facility] in [place] and cancel hydrocodone prescription.

## 2017-11-22 NOTE — Telephone Encounter (Signed)
Patient advised. Cancelled local prescription.

## 2017-11-25 ENCOUNTER — Ambulatory Visit (INDEPENDENT_AMBULATORY_CARE_PROVIDER_SITE_OTHER): Payer: BLUE CROSS/BLUE SHIELD

## 2017-11-25 DIAGNOSIS — M25562 Pain in left knee: Secondary | ICD-10-CM | POA: Diagnosis not present

## 2017-11-25 DIAGNOSIS — M545 Low back pain: Secondary | ICD-10-CM | POA: Diagnosis not present

## 2017-11-25 DIAGNOSIS — M48061 Spinal stenosis, lumbar region without neurogenic claudication: Secondary | ICD-10-CM

## 2017-11-25 DIAGNOSIS — M5416 Radiculopathy, lumbar region: Secondary | ICD-10-CM

## 2017-11-25 DIAGNOSIS — M23222 Derangement of posterior horn of medial meniscus due to old tear or injury, left knee: Secondary | ICD-10-CM | POA: Diagnosis not present

## 2017-11-25 DIAGNOSIS — M25561 Pain in right knee: Secondary | ICD-10-CM

## 2017-11-25 DIAGNOSIS — M5116 Intervertebral disc disorders with radiculopathy, lumbar region: Secondary | ICD-10-CM

## 2017-11-25 DIAGNOSIS — G8929 Other chronic pain: Secondary | ICD-10-CM

## 2017-12-03 ENCOUNTER — Ambulatory Visit: Payer: BLUE CROSS/BLUE SHIELD | Admitting: Sports Medicine

## 2017-12-03 ENCOUNTER — Encounter: Payer: Self-pay | Admitting: Sports Medicine

## 2017-12-03 DIAGNOSIS — M48061 Spinal stenosis, lumbar region without neurogenic claudication: Secondary | ICD-10-CM | POA: Diagnosis not present

## 2017-12-03 DIAGNOSIS — M25561 Pain in right knee: Secondary | ICD-10-CM

## 2017-12-03 DIAGNOSIS — M25562 Pain in left knee: Secondary | ICD-10-CM

## 2017-12-03 NOTE — Assessment & Plan Note (Addendum)
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 SI joint injections previously have not provided significant, long-term relief. Is failed physical therapy, NSAIDs. We are going to proceed with a left L4-L5 interlaminar epidural. Return to see me 1 month after injection.

## 2017-12-03 NOTE — Assessment & Plan Note (Signed)
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 Injection as above. Return in 1 month.

## 2017-12-03 NOTE — Patient Instructions (Signed)
Spinal Stenosis Spinal stenosis occurs when the open space (spinal canal) between the bones of your spine (vertebrae) narrows, putting pressure on the spinal cord or nerves. What are the causes? This condition is caused by areas of bone pushing into the central canals of your vertebrae. This condition may be present at birth (congenital), or it may be caused by:  Arthritic deterioration of your vertebrae (spinal degeneration). This usually starts around age 25.  Injury or trauma to the spine.  Tumors in the spine.  Calcium deposits in the spine.  What are the signs or symptoms? Symptoms of this condition include:  Pain in the neck or back that is generally worse with activities, particularly when standing and walking.  Numbness, tingling, hot or cold sensations, weakness, or weariness in your legs.  Pain going up and down the leg (sciatica).  Frequent episodes of falling.  A foot-slapping gait that leads to muscle weakness.  In more serious cases, you may develop:  Problemspassing stool or passing urine.  Difficulty having sex.  Loss of feeling in part or all of your leg.  Symptoms may come on slowly and get worse over time. How is this diagnosed? This condition is diagnosed based on your medical history and a physical exam. Tests will also be done, such as:  MRI.  CT scan.  X-ray.  How is this treated? Treatment for this condition often focuses on managing your pain and any other symptoms. Treatment may include:  Practicing good posture to lessen pressure on your nerves.  Exercising to strengthen muscles, build endurance, improve balance, and maintain good joint movement (range of motion).  Losing weight, if needed.  Taking medicines to reduce swelling, inflammation, or pain.  Assistive devices, such as a corset or brace.  In some cases, surgery may be needed. The most common procedure is decompression laminectomy. This is done to remove excess bone that  puts pressure on your nerve roots. Follow these instructions at home: Managing pain, stiffness, and swelling  Do all exercises and stretches as told by your health care provider.  Practice good posture. If you were given a brace or a corset, wear it as told by your health care provider.  Do not do any activities that cause pain. Ask your health care provider what activities are safe for you.  Do not lift anything that is heavier than 10 lb (4.5 kg) or the limit that your health care provider tells you.  Maintain a healthy weight. Talk with your health care provider if you need help losing weight.  If directed, apply heat to the affected area as often as told by your health care provider. Use the heat source that your health care provider recommends, such as a moist heat pack or a heating pad. ? Place a towel between your skin and the heat source. ? Leave the heat on for 20-30 minutes. ? Remove the heat if your skin turns bright red. This is especially important if you are not able to feel pain, heat, or cold. You may have a greater risk of getting burned. General instructions  Take over-the-counter and prescription medicines only as told by your health care provider.  Do not use any products that contain nicotine or tobacco, such as cigarettes and e-cigarettes. If you need help quitting, ask your health care provider.  Eat a healthy diet. This includes plenty of fruits and vegetables, whole grains, and low-fat (lean) protein.  Keep all follow-up visits as told by your health care provider.  This is important. Contact a health care provider if:  Your symptoms do not get better or they get worse.  You have a fever. Get help right away if:  You have new or worse pain in your neck or upper back.  You have severe pain that cannot be controlled with medicines.  You are dizzy.  You have vision problems, blurred vision, or double vision.  You have a severe headache that is worse when  you stand.  You have nausea or you vomit.  You develop new or worse numbness or tingling in your back or legs.  You have pain, redness, swelling, or warmth in your arm or leg. Summary  Spinal stenosis occurs when the open space (spinal canal) between the bones of your spine (vertebrae) narrows. This narrowing puts pressure on the spinal cord or nerves.  Spinal stenosis can cause numbness, weakness, or pain in the neck, back, and legs.  This condition may be caused by a birth defect, arthritic deterioration of your vertebrae, injury, tumors, or calcium deposits.  This condition is usually diagnosed with MRIs, CT scans, and X-rays. This information is not intended to replace advice given to you by your health care provider. Make sure you discuss any questions you have with your health care provider. Document Released: 06/30/2003 Document Revised: 03/14/2016 Document Reviewed: 03/14/2016 Elsevier Interactive Patient Education  2018 [employer]  

## 2017-12-03 NOTE — Progress Notes (Signed)
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  Pain is moderate, persistent, no mechanical symptoms.  Low back pain: MRI shows central canal stenosis with degenerative disc disease at L3-S1.  Mild multilevel and bilateral foraminal stenosis.  No bowel or bladder dysfunction, saddle numbness, constitutional symptoms.  I reviewed the past medical history, family history, social history, surgical history, and allergies today and no changes were needed.  Please see the problem list section below in epic for further details.  Past Medical History: Past Medical History:  Diagnosis Date  . Anxiety   . Benign tumor of thoracic site    64 mos old  . Depression   . Obesity    Past Surgical History: Past Surgical History:  Procedure Laterality Date  . KNEE ARTHROSCOPY     left 2011  . THORACOTOMY     21 mos old   Social History: Social History   Socioeconomic History  . Marital status: Married    Spouse name: Not on file  . Number of children: Not on file  . Years of education: Not on file  . Highest education level: Not on file  Occupational History  . Not on file  Social Needs  . Financial resource strain: Not on file  . Food insecurity:    Worry: Not on file    Inability: Not on file  . Transportation needs:    Medical: Not on file    Non-medical: Not on file  Tobacco Use  . Smoking status: Never Smoker  . Smokeless tobacco: Never Used  Substance and Sexual Activity  . Alcohol use: No  . Drug use: No  . Sexual activity: Yes    Partners: Female    Birth control/protection: None  Lifestyle  . Physical activity:    Days per week: Not on file    Minutes per session: Not on file  . Stress: Not on file  Relationships  . Social connections:    Talks on phone: Not on file    Gets together: Not on file    Attends religious service: Not on file   Active member of club or organization: Not on file    Attends meetings of clubs or organizations: Not on file    Relationship status: Not on file  Other Topics Concern  . Not on file  Social History Narrative  . Not on file   Family History: Family History  Problem Relation Age of Onset  . Cancer Mother   . Heart disease Mother   . Diabetes Mother   . Stroke Brother    Allergies: Allergies  Allergen Reactions  . Cymbalta [Duloxetine Hcl] Nausea And Vomiting    GeneSight: significant gene-drug interaction  . Luvox [Fluvoxamine] Other (See Comments)    GeneSight: significant gene-drug interaction   Medications: See med rec.  Review of Systems: No fevers, chills, night sweats, weight loss, chest pain, or shortness of breath.   Objective:    General: Well Developed, well nourished, and in no acute distress.  Neuro: Alert and oriented x3, extra-ocular muscles intact, sensation grossly intact.  HEENT: Normocephalic, atraumatic, pupils equal round reactive to light, neck supple, no masses, no lymphadenopathy, thyroid nonpalpable.  Skin: Warm and dry, no rashes. Cardiac: Regular rate and rhythm, no murmurs rubs or gallops, no lower extremity edema.  Respiratory: Clear to auscultation bilaterally. Not using accessory muscles, speaking in full sentences.  Procedure: Real-time Ultrasound Guided Injection of left knee Device: GE Logiq E  Verbal informed consent obtained.  Time-out conducted.  Noted no overlying erythema, induration, or other signs of local infection.  Skin prepped in a sterile fashion.  Local anesthesia: Topical Ethyl chloride.  With sterile technique and under real time ultrasound guidance: 1 cc kenalog 40, 2 cc lidocaine, 2 cc bupivacaine injected easily. Completed without difficulty  Pain immediately resolved suggesting accurate placement of the medication.  Advised to call if fevers/chills, erythema, induration, drainage, or persistent bleeding.  Images  permanently stored and available for review in the ultrasound unit.  Impression: Technically successful ultrasound guided injection.  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 Injection as above. Return in 1 month.  Lumbar spinal stenosis Central canal stenosis at L4-L5 without any overt neuroforaminal stenosis. SI joint injections previously have not provided significant, long-term relief. Is failed physical therapy, NSAIDs. We are going to proceed with a left L4-L5 interlaminar epidural. Return to see me 1 month after injection.  I spent 25 minutes with this patient, greater than 50% was face-to-face time counseling regarding the above diagnoses, this was separate from the time spent performing the above procedure ___________________________________________ Gwen Her. Dianah Field, M.D., ABFM., CAQSM. Primary Care and [place] Instructor of [place] [facility]

## 2017-12-10 ENCOUNTER — Telehealth: Payer: Self-pay

## 2017-12-10 DIAGNOSIS — S83207D Unspecified tear of unspecified meniscus, current injury, left knee, subsequent encounter: Secondary | ICD-10-CM

## 2017-12-10 NOTE — Telephone Encounter (Signed)
Patient advised.

## 2017-12-10 NOTE — Telephone Encounter (Signed)
Troy Wilkerson called and left a message stating the injection has not helped. Please advise.

## 2017-12-10 NOTE — Telephone Encounter (Signed)
This is going to need arthroscopic intervention, referral to Dr. Griffin Basil

## 2017-12-10 NOTE — Telephone Encounter (Signed)
Left message for recommendations.

## 2017-12-19 ENCOUNTER — Ambulatory Visit
Admission: RE | Admit: 2017-12-19 | Discharge: 2017-12-19 | Disposition: A | Discharge status: Home / Self Care | Payer: BLUE CROSS/BLUE SHIELD | Source: Ambulatory Visit | Attending: Sports Medicine | Admitting: Sports Medicine

## 2017-12-19 DIAGNOSIS — M5136 Other intervertebral disc degeneration, lumbar region: Secondary | ICD-10-CM | POA: Diagnosis not present

## 2017-12-19 DIAGNOSIS — M25562 Pain in left knee: Secondary | ICD-10-CM | POA: Diagnosis not present

## 2017-12-19 MED ORDER — METHYLPREDNISOLONE ACETATE 40 MG/ML INJ SUSP (RADIOLOG
120.0000 mg | Freq: Once | INTRAMUSCULAR | Status: AC
  Administered 2017-12-19: 120 mg via EPIDURAL

## 2017-12-19 MED ORDER — IOPAMIDOL (ISOVUE-M 200) INJECTION 41%
1.0000 mL | Freq: Once | INTRAMUSCULAR | Status: AC
  Administered 2017-12-19: 1 mL via EPIDURAL

## 2017-12-19 NOTE — Discharge Instructions (Signed)

## 2018-01-02 ENCOUNTER — Encounter: Payer: Self-pay | Admitting: Sports Medicine

## 2018-01-02 ENCOUNTER — Ambulatory Visit (INDEPENDENT_AMBULATORY_CARE_PROVIDER_SITE_OTHER): Payer: BLUE CROSS/BLUE SHIELD | Admitting: Sports Medicine

## 2018-01-02 DIAGNOSIS — M25562 Pain in left knee: Secondary | ICD-10-CM | POA: Diagnosis not present

## 2018-01-02 DIAGNOSIS — M25561 Pain in right knee: Secondary | ICD-10-CM | POA: Diagnosis not present

## 2018-01-02 DIAGNOSIS — M48061 Spinal stenosis, lumbar region without neurogenic claudication: Secondary | ICD-10-CM

## 2018-01-02 NOTE — Assessment & Plan Note (Addendum)
Central canal stenosis at L4-L5 without any overt neuroforaminal stenosis, SI joint injections previously provided no significant long-term relief. He had failed physical therapy, NSAIDs. Pain was mild to moderate. We proceeded with a left L4-L5 interlaminar epidural, at this point he tells me he really has no pain. I did encourage him to work aggressively on the core rehabilitation exercises he has learned with physical therapy, I provided him a new handout, this will make him less likely to need further intervention.

## 2018-01-02 NOTE — Assessment & Plan Note (Signed)
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 He saw Dr. Griffin Basil, persistent pain and arthroscopy scheduled for Christmas time.

## 2018-01-02 NOTE — Progress Notes (Signed)
Subjective:    CC: Back and knee  HPI: Spinal stenosis: Pain is for the most part resolved after L4-L5 interlaminar epidural.  Knee pain: Scheduled for arthroscopy with Dr. Griffin Basil.  I reviewed the past medical history, family history, social history, surgical history, and allergies today and no changes were needed.  Please see the problem list section below in epic for further details.  Past Medical History: Past Medical History:  Diagnosis Date  . Anxiety   . Benign tumor of thoracic site    17 mos old  . Depression   . Obesity    Past Surgical History: Past Surgical History:  Procedure Laterality Date  . KNEE ARTHROSCOPY     left 2011  . THORACOTOMY     73 mos old   Social History: Social History   Socioeconomic History  . Marital status: Married    Spouse name: Not on file  . Number of children: Not on file  . Years of education: Not on file  . Highest education level: Not on file  Occupational History  . Not on file  Social Needs  . Financial resource strain: Not on file  . Food insecurity:    Worry: Not on file    Inability: Not on file  . Transportation needs:    Medical: Not on file    Non-medical: Not on file  Tobacco Use  . Smoking status: Never Smoker  . Smokeless tobacco: Never Used  Substance and Sexual Activity  . Alcohol use: No  . Drug use: No  . Sexual activity: Yes    Partners: Female    Birth control/protection: None  Lifestyle  . Physical activity:    Days per week: Not on file    Minutes per session: Not on file  . Stress: Not on file  Relationships  . Social connections:    Talks on phone: Not on file    Gets together: Not on file    Attends religious service: Not on file    Active member of club or organization: Not on file    Attends meetings of clubs or organizations: Not on file    Relationship status: Not on file  Other Topics Concern  . Not on file  Social History Narrative  . Not on file   Family History: Family  History  Problem Relation Age of Onset  . Cancer Mother   . Heart disease Mother   . Diabetes Mother   . Stroke Brother    Allergies: Allergies  Allergen Reactions  . Cymbalta [Duloxetine Hcl] Nausea And Vomiting    GeneSight: significant gene-drug interaction  . Luvox [Fluvoxamine] Other (See Comments)    GeneSight: significant gene-drug interaction   Medications: See med rec.  Review of Systems: No fevers, chills, night sweats, weight loss, chest pain, or shortness of breath.   Objective:    General: Well Developed, well nourished, and in no acute distress.  Neuro: Alert and oriented x3, extra-ocular muscles intact, sensation grossly intact.  HEENT: Normocephalic, atraumatic, pupils equal round reactive to light, neck supple, no masses, no lymphadenopathy, thyroid nonpalpable.  Skin: Warm and dry, no rashes. Cardiac: Regular rate and rhythm, no murmurs rubs or gallops, no lower extremity edema.  Respiratory: Clear to auscultation bilaterally. Not using accessory muscles, speaking in full sentences.  Impression and Recommendations:    Lumbar spinal stenosis Central canal stenosis at L4-L5 without any overt neuroforaminal stenosis, SI joint injections previously provided no significant long-term relief. He had failed physical  therapy, NSAIDs. Pain was mild to moderate. We proceeded with a left L4-L5 interlaminar epidural, at this point he tells me he really has no pain. I did encourage him to work aggressively on the core rehabilitation exercises he has learned with physical therapy, I provided him a new handout, this will make him less likely to need further intervention.  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 He saw Dr. Griffin Basil, persistent pain and arthroscopy scheduled for Christmas time.  ___________________________________________ Gwen Her. Dianah Field, M.D., ABFM., CAQSM. Primary Care and [place] Instructor of [place] [facility]

## 2018-04-09 DIAGNOSIS — S83242A Other tear of medial meniscus, current injury, left knee, initial encounter: Secondary | ICD-10-CM | POA: Diagnosis not present

## 2018-04-09 DIAGNOSIS — X58XXXA Exposure to other specified factors, initial encounter: Secondary | ICD-10-CM | POA: Diagnosis not present

## 2018-04-09 DIAGNOSIS — G8918 Other acute postprocedural pain: Secondary | ICD-10-CM | POA: Diagnosis not present

## 2018-04-09 DIAGNOSIS — S83232A Complex tear of medial meniscus, current injury, left knee, initial encounter: Secondary | ICD-10-CM | POA: Diagnosis not present

## 2018-04-09 DIAGNOSIS — Y999 Unspecified external cause status: Secondary | ICD-10-CM | POA: Diagnosis not present

## 2018-04-14 ENCOUNTER — Ambulatory Visit (INDEPENDENT_AMBULATORY_CARE_PROVIDER_SITE_OTHER): Payer: BLUE CROSS/BLUE SHIELD | Admitting: Rehabilitative and Restorative Service Providers"

## 2018-04-14 ENCOUNTER — Other Ambulatory Visit: Payer: Self-pay

## 2018-04-14 DIAGNOSIS — Z7409 Other reduced mobility: Secondary | ICD-10-CM

## 2018-04-14 DIAGNOSIS — M25562 Pain in left knee: Secondary | ICD-10-CM | POA: Diagnosis not present

## 2018-04-14 DIAGNOSIS — M2560 Stiffness of unspecified joint, not elsewhere classified: Secondary | ICD-10-CM

## 2018-04-14 DIAGNOSIS — R29898 Other symptoms and signs involving the musculoskeletal system: Secondary | ICD-10-CM

## 2018-04-14 DIAGNOSIS — R293 Abnormal posture: Secondary | ICD-10-CM | POA: Diagnosis not present

## 2018-04-14 NOTE — Patient Instructions (Signed)
Quad Sets    Slowly tighten thigh muscles of straight, left leg while counting out loud to __5-10 seconds__. Relax. Repeat __10__ times. Do __2-3__ sessions per day.   HIP: Flexion / KNEE: Extension, Straight Leg Raise    Raise leg, keeping knee straight. Perform slowly. _10__ reps per set, __1-3_ sets per day, _2 times/day  Hamstring Step 1    Straighten left knee. Keep knee level with other knee or on bolster. Hold _30__ seconds. Relax knee by returning foot to start. Repeat __3_ times.  Heel Slide    Bend left knee and pull heel toward buttocks. Repeat _10___ times. Do __2-3__ sessions per day.   Self-Mobilization: Knee Flexion / Extension (Sitting)    Gently push left leg back with other leg until a stretch is felt. Hold _10___ seconds. Relax. Recross bent legs at ankles. Repeat _5-10___ times per set.  Do _2-3___ sessions per day. Can sit with foot planted on the floor and scoot your hips forward - hold 5-10 sec and repeat     .

## 2018-04-14 NOTE — Therapy (Signed)
[place], [place], [address] Phone: [phone]   Fax:  [phone]  Physical Therapy Evaluation  Patient Details  Name: Troy Wilkerson MRN: [id number] Date of Birth: [birth year] Referring Provider (PT): Dr Flossie Dibble    Encounter Date: 04/14/2018  PT End of Session - 04/14/18 1017    Visit Number  1    Number of Visits  12    Date for PT Re-Evaluation  05/26/18    PT Start Time  0914    PT Stop Time  1012    PT Time Calculation (min)  58 min    Activity Tolerance  Patient tolerated treatment well       Past Medical History:  Diagnosis Date  . Anxiety   . Benign tumor of thoracic site    35 mos old  . Depression   . Obesity     Past Surgical History:  Procedure Laterality Date  . KNEE ARTHROSCOPY     left 2011  . THORACOTOMY     6 mos old    There were no vitals filed for this visit.   Subjective Assessment - 04/14/18 0904    Subjective  Patient reports Lt knee pain over the past 4-6 months with no recert injuries. Pain progressively worsened and patient underwent Lt knee scope with debridement 04/09/18.     Pertinent History  Intermittent Lt mid back pain; Rt knee scope ~ 10 yrs ago    Diagnostic tests  xrays     Patient Stated Goals  be able play with kids and return to work and recreational activities     Currently in Pain?  Yes    Pain Score  3     Pain Location  Knee    Pain Orientation  Left    Pain Descriptors / Indicators  Dull;Aching   occasional throbbing    Pain Type  Surgical pain;Chronic pain    Pain Onset  In the past 7 days    Pain Frequency  Intermittent    Aggravating Factors   moving knee; walking; prolonged sitting     Pain Relieving Factors  rest          [facility] PT Assessment - 04/14/18 0001      Assessment   Medical Diagnosis  Lt knee scope     Referring Provider (PT)  Dr Flossie Dibble     Onset Date/Surgical Date  04/09/18   pain in the Lt knee for ~  6 months    Hand Dominance  Right    Next MD Visit  04/17/18    Prior Therapy  here for mid back       Precautions   Precautions  None      Restrictions   Weight Bearing Restrictions  No      Balance Screen   Has the patient fallen in the past 6 months  No    Has the patient had a decrease in activity level because of a fear of falling?   No    Is the patient reluctant to leave their home because of a fear of falling?   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     Leisure  two small children - playing; cariing for children       Observation/Other Assessments   Focus on Therapeutic Outcomes (FOTO)   67% limitation       Sensation   Additional Comments  WFL's per pt report - some numbness distal thigh - prior to surgery       Posture/Postural Control   Posture Comments  head forward; shoudlers rounded; wt shifted to the Rt in standing       AROM   Right Knee Extension  0    Right Knee Flexion  123    Left Knee Extension  -6    Right/Left Ankle  --   WFL's bilat      Strength   Overall Strength Comments  WFL's Rt LE; bilat UE's not tested Lt LE       Flexibility   Hamstrings  tight Lt ~ 65 deg; Rt 75 deg       Ambulation/Gait   Gait Comments  ambulates with one axilary crutch Rt side - limp Lt LE w/ wt bearing Lt; decreased stride length Rt LE step to with Rt; wt shifted to the Rt with ambulation                 Objective  measurements completed on examination: See above findings.      [facility] Adult PT Treatment/Exercise - 04/14/18 0001      Ambulation/Gait   Ambulation/Gait  --   gait training with SPC - x 120 feet working on gait pattern     Knee/Hip Exercises: Stretches   Passive Hamstring Stretch  Left;3 reps;30 seconds   supine with strap      Knee/Hip Exercises: Seated   Knee/Hip Flexion  knee flexion 10 sec hold x 8 reps       Knee/Hip Exercises: Supine   Quad Sets  Strengthening;Right;10 reps   5 sec hold    Heel Slides  AAROM;Left;10 reps    Straight Leg Raises  Strengthening;Left;10 reps   supine - opposite knee bent      Vasopneumatic   Number Minutes Vasopneumatic   15 minutes    Vasopnuematic Location   Knee    Vasopneumatic Pressure  Low    Vasopneumatic Temperature   34 deg              PT Education - 04/14/18 0951    Education Details  HEP     Person(s) Educated  Patient    Methods  Explanation;Demonstration;Verbal cues;Handout    Comprehension  Verbalized understanding;Returned demonstration;Verbal cues required;Tactile cues required          PT Long Term Goals - 04/14/18 1036      PT LONG TERM GOAL #1   Title  increase ROM Lt knee to within 2-5 degrees of ROM Rt knee 05/26/18    Time  6    Period  Weeks    Status  New      PT LONG TERM GOAL #2   Title  5/5 strength Lt LE 05/26/18    Time  6  Period  Weeks    Status  New      PT LONG TERM GOAL #3   Title  ambulate without assistive device with normal gait pattern 05/26/18     Time  6    Period  Weeks    Status  New      PT LONG TERM GOAL #4   Title  Independent in HEP 05/26/18    Time  6    Period  Weeks    Status  New      PT LONG TERM GOAL #5   Title  mprove FOTO to </= 40% limitation 05/26/18    Time  6    Period  Weeks    Status  New             Plan - 04/14/18 1018    Clinical Impression Statement  Patient presents s/p Lt knee scope 04/09/18. He has decreased ROM and strength Lt LE;  abnormal gait pattern; mild edema; pain and decreased functional activities.     History and Personal Factors relevant to plan of care:  Rt knee scope; Lt  mid back pain    Clinical Presentation  Evolving    Clinical Decision Making  Low    Rehab Potential  Good    PT Frequency  2x / week    PT Duration  6 weeks    PT Treatment/Interventions  Patient/family education;ADLs/Self Care Home Management;Cryotherapy;Electrical Stimulation;Iontophoresis 4mg /ml Dexamethasone;Moist Heat;Ultrasound;Scar mobilization;Passive range of motion;Balance training;Manual techniques;Therapeutic activities;Therapeutic exercise;Gait training    PT Next Visit Plan  review HEP; progress with ROM and strengthening Lt knee; gait training and balance activities as indicated     Consulted and Agree with Plan of Care  Patient       Patient will benefit from skilled therapeutic intervention in order to improve the following deficits and impairments:  Postural dysfunction, Improper body mechanics, Increased fascial restricitons, Hypomobility, Decreased mobility, Decreased range of motion, Decreased strength, Abnormal gait, Decreased activity tolerance  Visit Diagnosis: Acute pain of left knee - Plan: PT plan of care cert/re-cert  Stiffness due to immobility - Plan: PT plan of care cert/re-cert  Abnormal posture - Plan: PT plan of care cert/re-cert  Other symptoms and signs involving the musculoskeletal system - Plan: PT plan of care cert/re-cert     Problem List Patient Active Problem List   Diagnosis Date Noted  . Rotavirus enteritis 10/01/2017  . Encounter for nonprocreative genetic counseling and testing 09/13/2017  . Regurgitation of food 07/26/2017  . Hypertension goal BP (blood pressure) < 130/80 07/26/2017  . Lumbar spinal stenosis 07/24/2017  . Encounter for medication management 06/28/2017  . Habitual snoring 06/28/2017  . Long uvula 06/28/2017  . Excessive daytime sleepiness 06/28/2017  . Weight  gain due to medication 05/31/2017  . Class 1 obesity due to excess calories without serious comorbidity in adult 05/31/2017  . Chronic non-seasonal allergic rhinitis 04/22/2017  . Nonintractable episodic headache 04/17/2017  . Epigastric pain 04/11/2017  . Non-intractable cyclical vomiting with nausea 02/28/2017  . Dyspepsia 02/28/2017  . Nausea 02/07/2017  . Paronychia of finger of right hand 02/07/2017  . Anxiety associated with depression 01/16/2017  . Bone lesion 01/10/2017  . Low libido 01/10/2017  . Tennis elbow 12/27/2016  . Knee pain, bilateral 12/27/2016  . Depression, major, single episode, moderate ([place]) 12/27/2016    Zaelyn Noack Nilda Simmer PT, MPH  04/14/2018, 10:50 AM  [facility] [place], [place],  [place] Phone: [phone]   Fax:  [phone]  Name: Damario Gillie MRN: [id number] Date of Birth: 07-03-1980

## 2018-04-17 DIAGNOSIS — S83242D Other tear of medial meniscus, current injury, left knee, subsequent encounter: Secondary | ICD-10-CM | POA: Diagnosis not present

## 2018-04-22 ENCOUNTER — Ambulatory Visit (INDEPENDENT_AMBULATORY_CARE_PROVIDER_SITE_OTHER): Payer: BLUE CROSS/BLUE SHIELD | Admitting: Physical Therapy

## 2018-04-22 ENCOUNTER — Encounter: Payer: Self-pay | Admitting: Physical Therapy

## 2018-04-22 DIAGNOSIS — M2560 Stiffness of unspecified joint, not elsewhere classified: Secondary | ICD-10-CM | POA: Diagnosis not present

## 2018-04-22 DIAGNOSIS — M25562 Pain in left knee: Secondary | ICD-10-CM | POA: Diagnosis not present

## 2018-04-22 DIAGNOSIS — R29898 Other symptoms and signs involving the musculoskeletal system: Secondary | ICD-10-CM

## 2018-04-22 DIAGNOSIS — R293 Abnormal posture: Secondary | ICD-10-CM | POA: Diagnosis not present

## 2018-04-22 DIAGNOSIS — Z7409 Other reduced mobility: Secondary | ICD-10-CM

## 2018-04-22 NOTE — Therapy (Signed)
[place], [place], [address] Phone: [phone]   Fax:  [phone]  Physical Therapy Treatment  Patient Details  Name: Troy Wilkerson MRN: [id number] Date of Birth: 1980-10-03 Referring Provider (PT): Dr Flossie Dibble    Encounter Date: 04/22/2018  PT End of Session - 04/22/18 0927    Visit Number  2    Number of Visits  12    Date for PT Re-Evaluation  05/26/18    PT Start Time  0850    PT Stop Time  0944    PT Time Calculation (min)  54 min    Activity Tolerance  Patient tolerated treatment well    Behavior During Therapy  [facility] for tasks assessed/performed       Past Medical History:  Diagnosis Date  . Anxiety   . Benign tumor of thoracic site    13 mos old  . Depression   . Obesity     Past Surgical History:  Procedure Laterality Date  . KNEE ARTHROSCOPY     left 2011  . THORACOTOMY     6 mos old    There were no vitals filed for this visit.  Subjective Assessment - 04/22/18 0853    Subjective  denies pain; just tightness.      Patient Stated Goals  be able play with kids and return to work and recreational activities     Currently in Pain?  No/denies         [facility] PT Assessment - 04/22/18 0911      Assessment   Medical Diagnosis  Lt knee scope     Referring Provider (PT)  Dr Flossie Dibble     Onset Date/Surgical Date  04/09/18    Hand Dominance  Right    Next MD Visit  end of Jan      ROM / Strength   AROM / PROM / Strength  PROM      AROM   Left Knee Extension  -2    Left Knee Flexion  120      PROM   PROM Assessment Site  Knee    Right/Left Knee  Left    Left Knee Flexion  125                   [facility] Adult PT Treatment/Exercise - 04/22/18 0853      Exercises   Exercises  Knee/Hip      Knee/Hip Exercises: Stretches   Passive Hamstring Stretch  Left;3 reps;30 seconds   supine with strap      Knee/Hip Exercises: Aerobic   Recumbent Bike   partial revolutions x 8 min for ROM      Knee/Hip Exercises: Supine   Quad Sets  Strengthening;Right;10 reps   5 sec hold    Quad Sets Limitations  long sitting; needed visual cues to activate quad    Short Arc Quad Sets  10 reps;Strengthening;Left   5 sec hold   Heel Slides  AAROM;Left;10 reps    Straight Leg Raises  Strengthening;Left;10 reps   supine - opposite knee bent    Straight Leg Raises Limitations  min cues to decrease quad lag      Knee/Hip Exercises: Sidelying   Hip ABduction  Left;10 reps    Hip ABduction Limitations  min cues for form    Hip ADduction  Left;10 reps      Knee/Hip Exercises: Prone   Hip Extension  Left;10 reps  Vasopneumatic   Number Minutes Vasopneumatic   15 minutes    Vasopnuematic Location   Knee    Vasopneumatic Pressure  Medium    Vasopneumatic Temperature   34 deg                   PT Long Term Goals - 04/14/18 1036      PT LONG TERM GOAL #1   Title  increase ROM Lt knee to within 2-5 degrees of ROM Rt knee 05/26/18    Time  6    Period  Weeks    Status  New      PT LONG TERM GOAL #2   Title  5/5 strength Lt LE 05/26/18    Time  6    Period  Weeks    Status  New      PT LONG TERM GOAL #3   Title  ambulate without assistive device with normal gait pattern 05/26/18     Time  6    Period  Weeks    Status  New      PT LONG TERM GOAL #4   Title  Independent in HEP 05/26/18    Time  6    Period  Weeks    Status  New      PT LONG TERM GOAL #5   Title  mprove FOTO to </= 40% limitation 05/26/18    Time  6    Period  Weeks    Status  New            Plan - 04/22/18 6568    Clinical Impression Statement  Pt with improved ROM, and improved quad control with mod cues for quad activation.  Progressing well towards goals.    Rehab Potential  Good    PT Frequency  2x / week    PT Duration  6 weeks    PT Treatment/Interventions  Patient/family education;ADLs/Self Care Home Management;Cryotherapy;Electrical  Stimulation;Iontophoresis 4mg /ml Dexamethasone;Moist Heat;Ultrasound;Scar mobilization;Passive range of motion;Balance training;Manual techniques;Therapeutic activities;Therapeutic exercise;Gait training    PT Next Visit Plan  review HEP; progress with ROM and strengthening Lt knee; gait training and balance activities as indicated     Consulted and Agree with Plan of Care  Patient       Patient will benefit from skilled therapeutic intervention in order to improve the following deficits and impairments:  Postural dysfunction, Improper body mechanics, Increased fascial restricitons, Hypomobility, Decreased mobility, Decreased range of motion, Decreased strength, Abnormal gait, Decreased activity tolerance  Visit Diagnosis: Acute pain of left knee  Stiffness due to immobility  Abnormal posture  Other symptoms and signs involving the musculoskeletal system     Problem List Patient Active Problem List   Diagnosis Date Noted  . Rotavirus enteritis 10/01/2017  . Encounter for nonprocreative genetic counseling and testing 09/13/2017  . Regurgitation of food 07/26/2017  . Hypertension goal BP (blood pressure) < 130/80 07/26/2017  . Lumbar spinal stenosis 07/24/2017  . Encounter for medication management 06/28/2017  . Habitual snoring 06/28/2017  . Long uvula 06/28/2017  . Excessive daytime sleepiness 06/28/2017  . Weight gain due to medication 05/31/2017  . Class 1 obesity due to excess calories without serious comorbidity in adult 05/31/2017  . Chronic non-seasonal allergic rhinitis 04/22/2017  . Nonintractable episodic headache 04/17/2017  . Epigastric pain 04/11/2017  . Non-intractable cyclical vomiting with nausea 02/28/2017  . Dyspepsia 02/28/2017  . Nausea 02/07/2017  . Paronychia of finger of right hand 02/07/2017  . Anxiety associated  with depression 01/16/2017  . Bone lesion 01/10/2017  . Low libido 01/10/2017  . Tennis elbow 12/27/2016  . Knee pain, bilateral  12/27/2016  . Depression, major, single episode, moderate ([place]) 12/27/2016      Laureen Abrahams, PT, DPT 04/22/18 9:29 AM    [facility] [place], [place], [address] Phone: [phone]   Fax:  [phone]  Name: Karston Hyland MRN: [id number] Date of Birth: Aug 23, 1980

## 2018-04-25 ENCOUNTER — Ambulatory Visit (INDEPENDENT_AMBULATORY_CARE_PROVIDER_SITE_OTHER): Payer: BLUE CROSS/BLUE SHIELD | Admitting: Physical Therapy

## 2018-04-25 ENCOUNTER — Encounter: Payer: Self-pay | Admitting: Physical Therapy

## 2018-04-25 DIAGNOSIS — Z7409 Other reduced mobility: Secondary | ICD-10-CM

## 2018-04-25 DIAGNOSIS — R293 Abnormal posture: Secondary | ICD-10-CM | POA: Diagnosis not present

## 2018-04-25 DIAGNOSIS — M25562 Pain in left knee: Secondary | ICD-10-CM

## 2018-04-25 DIAGNOSIS — R29898 Other symptoms and signs involving the musculoskeletal system: Secondary | ICD-10-CM | POA: Diagnosis not present

## 2018-04-25 DIAGNOSIS — M2560 Stiffness of unspecified joint, not elsewhere classified: Secondary | ICD-10-CM

## 2018-04-25 NOTE — Therapy (Signed)
[place], [place], [address] Phone: [phone]   Fax:  [phone]  Physical Therapy Treatment  Patient Details  Name: Troy Wilkerson MRN: [id number] Date of Birth: 05/20/1980 Referring Provider (PT): Dr Flossie Dibble    Encounter Date: 04/25/2018  PT End of Session - 04/25/18 1036    Visit Number  3    Number of Visits  12    Date for PT Re-Evaluation  05/26/18    PT Start Time  0850    PT Stop Time  0945    PT Time Calculation (min)  55 min    Activity Tolerance  Patient tolerated treatment well    Behavior During Therapy  [facility] for tasks assessed/performed       Past Medical History:  Diagnosis Date  . Anxiety   . Benign tumor of thoracic site    20 mos old  . Depression   . Obesity     Past Surgical History:  Procedure Laterality Date  . KNEE ARTHROSCOPY     left 2011  . THORACOTOMY     6 mos old    There were no vitals filed for this visit.  Subjective Assessment - 04/25/18 0853    Subjective  knee is doing well; occasionally feels like the knee is "loose" but then other times knee feels great.    Patient Stated Goals  be able play with kids and return to work and recreational activities     Currently in Pain?  No/denies         [facility] PT Assessment - 04/25/18 0913      Assessment   Medical Diagnosis  Lt knee scope     Referring Provider (PT)  Dr Flossie Dibble     Onset Date/Surgical Date  04/09/18      AROM   Left Knee Flexion  129                   [facility] Adult PT Treatment/Exercise - 04/25/18 0854      Ambulation/Gait   Stairs  Yes    Stairs Assistance  5: Supervision    Stairs Assistance Details (indicate cue type and reason)  cues for eccentric quad control with descent and form with ascending with RLE    Stair Management Technique  One rail Left;Alternating pattern;Forwards    Number of Stairs  10    Height of Stairs  6      Self-Care   Self-Care   Other Self-Care Comments    Other Self-Care Comments   seated or supine heel prop at home for passive extension      Knee/Hip Exercises: Stretches   Passive Hamstring Stretch  Left;3 reps;30 seconds   supine with strap      Knee/Hip Exercises: Aerobic   Recumbent Bike  8 min for warm up; slowly working seat closer      Knee/Hip Exercises: Standing   Terminal Knee Extension  Left;10 reps;Theraband    Theraband Level (Terminal Knee Extension)  Level 4 (Blue)    Terminal Knee Extension Limitations  5 sec hold      Knee/Hip Exercises: Seated   Long Arc Quad  Left;10 reps;Weights    Long Arc Quad Weight  4 lbs.    Long [employer] Limitations  with ball squeeze; focus on eccentric control      Knee/Hip Exercises: Supine   Quad Sets  Strengthening;Right;10 reps    [employer] Limitations  improved  quad control this visit    Heel Slides  AAROM;Left;10 reps      Vasopneumatic   Number Minutes Vasopneumatic   15 minutes    Vasopnuematic Location   Knee    Vasopneumatic Pressure  Medium    Vasopneumatic Temperature   34 deg       Manual Therapy   Manual Therapy  Passive ROM    Passive ROM  Lt knee extension with end range holds             PT Education - 04/25/18 1036    Education Details  see self care    Person(s) Educated  Patient    Methods  Explanation    Comprehension  Verbalized understanding          PT Long Term Goals - 04/14/18 1036      PT LONG TERM GOAL #1   Title  increase ROM Lt knee to within 2-5 degrees of ROM Rt knee 05/26/18    Time  6    Period  Weeks    Status  New      PT LONG TERM GOAL #2   Title  5/5 strength Lt LE 05/26/18    Time  6    Period  Weeks    Status  New      PT LONG TERM GOAL #3   Title  ambulate without assistive device with normal gait pattern 05/26/18     Time  6    Period  Weeks    Status  New      PT LONG TERM GOAL #4   Title  Independent in HEP 05/26/18    Time  6    Period  Weeks    Status  New      PT LONG TERM GOAL  #5   Title  mprove FOTO to </= 40% limitation 05/26/18    Time  6    Period  Weeks    Status  New            Plan - 04/25/18 1037    Clinical Impression Statement  Pt with greatly improved Lt knee flexion which is now back to WNL, but continues to lack ~ 3-5 degrees of knee extension at this time.  Recommended focus on knee extension especially when resting at home.  Pt will continue to benefit from PT to maximize function.  Continues to have difficulty with functional quad activities, but improved quad control noted today with quad sets.  Progressing well.    Rehab Potential  Good    PT Frequency  2x / week    PT Duration  6 weeks    PT Treatment/Interventions  Patient/family education;ADLs/Self Care Home Management;Cryotherapy;Electrical Stimulation;Iontophoresis 4mg /ml Dexamethasone;Moist Heat;Ultrasound;Scar mobilization;Passive range of motion;Balance training;Manual techniques;Therapeutic activities;Therapeutic exercise;Gait training    PT Next Visit Plan  focus on extension, maintain flexion gains.  needs eccentric quad control and closed chain strengthening    Consulted and Agree with Plan of Care  Patient       Patient will benefit from skilled therapeutic intervention in order to improve the following deficits and impairments:  Postural dysfunction, Improper body mechanics, Increased fascial restricitons, Hypomobility, Decreased mobility, Decreased range of motion, Decreased strength, Abnormal gait, Decreased activity tolerance  Visit Diagnosis: Acute pain of left knee  Stiffness due to immobility  Abnormal posture  Other symptoms and signs involving the musculoskeletal system     Problem List Patient Active Problem List   Diagnosis Date Noted  .  Rotavirus enteritis 10/01/2017  . Encounter for nonprocreative genetic counseling and testing 09/13/2017  . Regurgitation of food 07/26/2017  . Hypertension goal BP (blood pressure) < 130/80 07/26/2017  . Lumbar spinal  stenosis 07/24/2017  . Encounter for medication management 06/28/2017  . Habitual snoring 06/28/2017  . Long uvula 06/28/2017  . Excessive daytime sleepiness 06/28/2017  . Weight gain due to medication 05/31/2017  . Class 1 obesity due to excess calories without serious comorbidity in adult 05/31/2017  . Chronic non-seasonal allergic rhinitis 04/22/2017  . Nonintractable episodic headache 04/17/2017  . Epigastric pain 04/11/2017  . Non-intractable cyclical vomiting with nausea 02/28/2017  . Dyspepsia 02/28/2017  . Nausea 02/07/2017  . Paronychia of finger of right hand 02/07/2017  . Anxiety associated with depression 01/16/2017  . Bone lesion 01/10/2017  . Low libido 01/10/2017  . Tennis elbow 12/27/2016  . Knee pain, bilateral 12/27/2016  . Depression, major, single episode, moderate ([place]) 12/27/2016      Laureen Abrahams, PT, DPT 04/25/18 10:40 AM    [facility] [place], [place], [address] Phone: [phone]   Fax:  [phone]  Name: Troy Wilkerson MRN: [id number] Date of Birth: 1980/04/30

## 2018-04-30 ENCOUNTER — Encounter: Payer: Self-pay | Admitting: Physical Therapy

## 2018-04-30 ENCOUNTER — Ambulatory Visit (INDEPENDENT_AMBULATORY_CARE_PROVIDER_SITE_OTHER): Payer: BLUE CROSS/BLUE SHIELD | Admitting: Physical Therapy

## 2018-04-30 DIAGNOSIS — M25562 Pain in left knee: Secondary | ICD-10-CM | POA: Diagnosis not present

## 2018-04-30 DIAGNOSIS — R29898 Other symptoms and signs involving the musculoskeletal system: Secondary | ICD-10-CM | POA: Diagnosis not present

## 2018-04-30 DIAGNOSIS — Z7409 Other reduced mobility: Secondary | ICD-10-CM

## 2018-04-30 DIAGNOSIS — R293 Abnormal posture: Secondary | ICD-10-CM | POA: Diagnosis not present

## 2018-04-30 DIAGNOSIS — M2560 Stiffness of unspecified joint, not elsewhere classified: Secondary | ICD-10-CM

## 2018-04-30 NOTE — Therapy (Signed)
Troy Wilkerson, [place], [address] Phone: [phone]   Fax:  [phone]  Physical Therapy Treatment  Patient Details  Name: Troy Wilkerson MRN: [id number] Date of Birth: 30-Nov-1980 Referring Provider (PT): Dr Flossie Dibble    Encounter Date: 04/30/2018  PT End of Session - 04/30/18 0927    Visit Number  4    Number of Visits  12    Date for PT Re-Evaluation  05/26/18    PT Start Time  0800    PT Stop Time  0855    PT Time Calculation (min)  55 min    Activity Tolerance  Patient tolerated treatment well    Behavior During Therapy  [facility] for tasks assessed/performed       Past Medical History:  Diagnosis Date  . Anxiety   . Benign tumor of thoracic site    56 mos old  . Depression   . Obesity     Past Surgical History:  Procedure Laterality Date  . KNEE ARTHROSCOPY     left 2011  . THORACOTOMY     6 mos old    There were no vitals filed for this visit.  Subjective Assessment - 04/30/18 0802    Subjective  sore today; went back to work on Monday and thinks he may have overdone it with the knee.      Pertinent History  Intermittent Lt mid back pain; Rt knee scope ~ 10 yrs ago    Patient Stated Goals  be able play with kids and return to work and recreational activities     Currently in Pain?  No/denies                       [facility] Adult PT Treatment/Exercise - 04/30/18 0804      Knee/Hip Exercises: Stretches   Passive Hamstring Stretch  Left;3 reps;30 seconds   supine with strap      Knee/Hip Exercises: Aerobic   Recumbent Bike  8 min for warm up; slowly working seat closer      Knee/Hip Exercises: Machines for Strengthening   Cybex Knee Extension  LLE only: 1 plate 3x10    Total Gym Leg Press  5 plates 3x10; cues for using LLE more than RLE      Knee/Hip Exercises: Standing   Wall Squat  10 reps;5 seconds    Wall Squat Limitations  quarter wall squat      Vasopneumatic    Number Minutes Vasopneumatic   15 minutes    Vasopnuematic Location   Knee    Vasopneumatic Pressure  Medium    Vasopneumatic Temperature   34 deg       Manual Therapy   Manual Therapy  Passive ROM    Passive ROM  Lt knee extension with end range holds             PT Education - 04/30/18 0927    Education Details  HEP    Person(s) Educated  Patient    Methods  Explanation    Comprehension  Verbalized understanding          PT Long Term Goals - 04/14/18 1036      PT LONG TERM GOAL #1   Title  increase ROM Lt knee to within 2-5 degrees of ROM Rt knee 05/26/18    Time  6    Period  Weeks    Status  New  PT LONG TERM GOAL #2   Title  5/5 strength Lt LE 05/26/18    Time  6    Period  Weeks    Status  New      PT LONG TERM GOAL #3   Title  ambulate without assistive device with normal gait pattern 05/26/18     Time  6    Period  Weeks    Status  New      PT LONG TERM GOAL #4   Title  Independent in HEP 05/26/18    Time  6    Period  Weeks    Status  New      PT LONG TERM GOAL #5   Title  mprove FOTO to </= 40% limitation 05/26/18    Time  6    Period  Weeks    Status  New            Plan - 04/30/18 0836    Clinical Impression Statement  Pt tolerated strengthening exercises well today and updated HEP to reflect strengthening exercises, and to continue to work on extension at home.  Progressing well with PT at this time.    Rehab Potential  Good    PT Frequency  2x / week    PT Duration  6 weeks    PT Treatment/Interventions  Patient/family education;ADLs/Self Care Home Management;Cryotherapy;Electrical Stimulation;Iontophoresis 4mg /ml Dexamethasone;Moist Heat;Ultrasound;Scar mobilization;Passive range of motion;Balance training;Manual techniques;Therapeutic activities;Therapeutic exercise;Gait training    PT Next Visit Plan  focus on extension, maintain flexion gains.  needs eccentric quad control and closed chain strengthening    PT Home Exercise Plan   Access Code: [email or web address]    Consulted and Agree with Plan of Care  Patient       Patient will benefit from skilled therapeutic intervention in order to improve the following deficits and impairments:  Postural dysfunction, Improper body mechanics, Increased fascial restricitons, Hypomobility, Decreased mobility, Decreased range of motion, Decreased strength, Abnormal gait, Decreased activity tolerance  Visit Diagnosis: Acute pain of left knee  Stiffness due to immobility  Abnormal posture  Other symptoms and signs involving the musculoskeletal system     Problem List Patient Active Problem List   Diagnosis Date Noted  . Rotavirus enteritis 10/01/2017  . Encounter for nonprocreative genetic counseling and testing 09/13/2017  . Regurgitation of food 07/26/2017  . Hypertension goal BP (blood pressure) < 130/80 07/26/2017  . Lumbar spinal stenosis 07/24/2017  . Encounter for medication management 06/28/2017  . Habitual snoring 06/28/2017  . Long uvula 06/28/2017  . Excessive daytime sleepiness 06/28/2017  . Weight gain due to medication 05/31/2017  . Class 1 obesity due to excess calories without serious comorbidity in adult 05/31/2017  . Chronic non-seasonal allergic rhinitis 04/22/2017  . Nonintractable episodic headache 04/17/2017  . Epigastric pain 04/11/2017  . Non-intractable cyclical vomiting with nausea 02/28/2017  . Dyspepsia 02/28/2017  . Nausea 02/07/2017  . Paronychia of finger of right hand 02/07/2017  . Anxiety associated with depression 01/16/2017  . Bone lesion 01/10/2017  . Low libido 01/10/2017  . Tennis elbow 12/27/2016  . Knee pain, bilateral 12/27/2016  . Depression, major, single episode, moderate ([place]) 12/27/2016      Troy Wilkerson, PT, DPT 04/30/18 9:29 AM    [facility] [place], [place], [address] Phone: [phone]   Fax:  [phone]  Name: Troy Wilkerson MRN:  [id number] Date of Birth: 03/30/1981

## 2018-04-30 NOTE — Patient Instructions (Signed)
Access Code: [email or web address]  URL: [email or web address]  Date: 04/30/2018  Prepared by: Faustino Congress   Exercises  Step Up - 10 reps - 2 sets - 1x daily - 7x weekly  Lateral Step Up - 10 reps - 2 sets - 1x daily - 7x weekly  Wall Quarter Squat - 10 reps - 2 sets - 5-10 sec hold - 1x daily - 7x weekly

## 2018-05-02 ENCOUNTER — Ambulatory Visit: Payer: BLUE CROSS/BLUE SHIELD | Admitting: Physical Therapy

## 2018-05-02 DIAGNOSIS — M2560 Stiffness of unspecified joint, not elsewhere classified: Secondary | ICD-10-CM

## 2018-05-02 DIAGNOSIS — M25562 Pain in left knee: Secondary | ICD-10-CM | POA: Diagnosis not present

## 2018-05-02 DIAGNOSIS — Z7409 Other reduced mobility: Secondary | ICD-10-CM | POA: Diagnosis not present

## 2018-05-02 NOTE — Therapy (Signed)
[place], [place], [address] Phone: [phone]   Fax:  [phone]  Physical Therapy Treatment  Patient Details  Name: Troy Wilkerson MRN: [id number] Date of Birth: 1980/12/23 Referring Provider (PT): Dr Flossie Dibble    Encounter Date: 05/02/2018  PT End of Session - 05/02/18 1149    Visit Number  5    Number of Visits  12    Date for PT Re-Evaluation  05/26/18    PT Start Time  1101    PT Stop Time  1200    PT Time Calculation (min)  59 min    Activity Tolerance  Patient tolerated treatment well    Behavior During Therapy  [facility] for tasks assessed/performed       Past Medical History:  Diagnosis Date  . Anxiety   . Benign tumor of thoracic site    3 mos old  . Depression   . Obesity     Past Surgical History:  Procedure Laterality Date  . KNEE ARTHROSCOPY     left 2011  . THORACOTOMY     6 mos old    There were no vitals filed for this visit.  Subjective Assessment - 05/02/18 1105    Subjective  Pt reports his Lt knee is more sore this morning; not sure if it is the change in weather.     Currently in Pain?  Yes    Pain Score  4     Pain Location  Knee    Pain Orientation  Left    Pain Descriptors / Indicators  Simonne Martinet PT Assessment - 05/02/18 0001      Assessment   Medical Diagnosis  Lt knee scope     Referring Provider (PT)  Dr Flossie Dibble     Onset Date/Surgical Date  04/09/18    Hand Dominance  Right    Next MD Visit  05/22/18      [facility]   Right/Left Knee  Left    Left Knee Extension  -3    Left Knee Flexion  125      Flexibility   Soft Tissue Assessment /Muscle Length  yes    Quadriceps  LLE 90 deg         [facility] Adult PT Treatment/Exercise - 05/02/18 0001      Self-Care   Self-Care  Other Self-Care Comments    Other Self-Care Comments   Pt educated about scar massage and desensitization of Lt knee; demo provided and pt returned demo with  verbalizing understanding       Knee/Hip Exercises: Stretches   Passive Hamstring Stretch  Left;3 reps;30 seconds   supine with strap    Quad Stretch  Right;1 rep;Left;3 reps;30 seconds   prone with strap   Gastroc Stretch  Both;2 reps;60 seconds   incline board     Knee/Hip Exercises: Aerobic   Recumbent Bike  L1: 7 min for warm up and ROM.       Knee/Hip Exercises: Standing   Lateral Step Up  Left;1 set;10 reps;Hand Hold: 1;Step Height: 8"    Forward Step Up  Left;1 set;10 reps;Hand Hold: 1;Step Height: 8"    Step Down  Right;1 set;10 reps;Hand Hold: 2;Step Height: 4"    Gait Training  Multiple trials of ~40 ft with mirror for feedback:  VC for level shoulders, rolling through Lt foot, increasing hip ext during toe off, evening step length; improved  quality and cadence with increased reps and cues.       Vasopneumatic   Number Minutes Vasopneumatic   15 minutes    Vasopnuematic Location   Knee    Vasopneumatic Pressure  Medium    Vasopneumatic Temperature   34 deg       Manual Therapy   Manual Therapy  Soft tissue mobilization    Soft tissue mobilization  STM to Lt hamstring to decrease fascial restrictions              PT Education - 05/02/18 1208    Education Details  updated HEP     Person(s) Educated  Patient    Methods  Explanation;Handout;Verbal cues    Comprehension  Verbalized understanding;Returned demonstration          PT Long Term Goals - 04/14/18 1036      PT LONG TERM GOAL #1   Title  increase ROM Lt knee to within 2-5 degrees of ROM Rt knee 05/26/18    Time  6    Period  Weeks    Status  New      PT LONG TERM GOAL #2   Title  5/5 strength Lt LE 05/26/18    Time  6    Period  Weeks    Status  New      PT LONG TERM GOAL #3   Title  ambulate without assistive device with normal gait pattern 05/26/18     Time  6    Period  Weeks    Status  New      PT LONG TERM GOAL #4   Title  Independent in HEP 05/26/18    Time  6    Period  Weeks     Status  New      PT LONG TERM GOAL #5   Title  mprove FOTO to </= 40% limitation 05/26/18    Time  6    Period  Weeks    Status  New            Plan - 05/02/18 1259    Clinical Impression Statement  Pt's Lt knee ROM similar to last assessment, slightly less in flexion.  Left quad remains tight; added quad stretch to HEP.  Gait is also antalgic with deviations; with cues and repetition, gait pattern improved.  Pt reported some pain below patellar tendon with eccentric return from step ups; reduced with rest and stretches.  Goals are ongoing at this time.     Rehab Potential  Good    PT Frequency  2x / week    PT Duration  6 weeks    PT Treatment/Interventions  Patient/family education;ADLs/Self Care Home Management;Cryotherapy;Electrical Stimulation;Iontophoresis 4mg /ml Dexamethasone;Moist Heat;Ultrasound;Scar mobilization;Passive range of motion;Balance training;Manual techniques;Therapeutic activities;Therapeutic exercise;Gait training    PT Next Visit Plan  focus on extension, maintain flexion gains.  needs eccentric quad control and closed chain strengthening    PT Home Exercise Plan  Access Code: [email or web address]    Consulted and Agree with Plan of Care  Patient       Patient will benefit from skilled therapeutic intervention in order to improve the following deficits and impairments:  Postural dysfunction, Improper body mechanics, Increased fascial restricitons, Hypomobility, Decreased mobility, Decreased range of motion, Decreased strength, Abnormal gait, Decreased activity tolerance  Visit Diagnosis: Acute pain of left knee  Stiffness due to immobility     Problem List Patient Active Problem List   Diagnosis Date Noted  . Rotavirus enteritis  10/01/2017  . Encounter for nonprocreative genetic counseling and testing 09/13/2017  . Regurgitation of food 07/26/2017  . Hypertension goal BP (blood pressure) < 130/80 07/26/2017  . Lumbar spinal stenosis 07/24/2017  . Encounter for  medication management 06/28/2017  . Habitual snoring 06/28/2017  . Long uvula 06/28/2017  . Excessive daytime sleepiness 06/28/2017  . Weight gain due to medication 05/31/2017  . Class 1 obesity due to excess calories without serious comorbidity in adult 05/31/2017  . Chronic non-seasonal allergic rhinitis 04/22/2017  . Nonintractable episodic headache 04/17/2017  . Epigastric pain 04/11/2017  . Non-intractable cyclical vomiting with nausea 02/28/2017  . Dyspepsia 02/28/2017  . Nausea 02/07/2017  . Paronychia of finger of right hand 02/07/2017  . Anxiety associated with depression 01/16/2017  . Bone lesion 01/10/2017  . Low libido 01/10/2017  . Tennis elbow 12/27/2016  . Knee pain, bilateral 12/27/2016  . Depression, major, single episode, moderate ([place]) 12/27/2016   Kerin Perna, PTA 05/02/18 1:08 PM  [facility] [place], [place], [address] Phone: [phone]   Fax:  [phone]  Name: Troy Wilkerson MRN: [id number] Date of Birth: [birth year]

## 2018-05-02 NOTE — Patient Instructions (Signed)
Access Code: [email or web address]  URL: [email or web address]  Date: 05/02/2018  Prepared by: Kerin Perna   Exercises  Step Up - 10 reps - 2 sets - 1x daily - 7x weekly  Lateral Step Up - 10 reps - 2 sets - 1x daily - 7x weekly  Wall Quarter Squat - 10 reps - 2 sets - 5-10 sec hold - 1x daily - 7x weekly  Prone Quadriceps Stretch with Strap - 3 reps - 1 sets - 30 seconds hold - 1-2x daily - 7x weekly  Gastroc Stretch on Wall - 3 reps - 1 sets - 30 seconds hold - 1-2x daily - 7x weekly  Patient Education  Scar Massage

## 2018-05-06 ENCOUNTER — Ambulatory Visit: Payer: BLUE CROSS/BLUE SHIELD | Admitting: Physical Therapy

## 2018-05-06 ENCOUNTER — Encounter: Payer: Self-pay | Admitting: Physical Therapy

## 2018-05-06 DIAGNOSIS — R29898 Other symptoms and signs involving the musculoskeletal system: Secondary | ICD-10-CM

## 2018-05-06 DIAGNOSIS — M25562 Pain in left knee: Secondary | ICD-10-CM

## 2018-05-06 DIAGNOSIS — M2560 Stiffness of unspecified joint, not elsewhere classified: Secondary | ICD-10-CM

## 2018-05-06 DIAGNOSIS — Z7409 Other reduced mobility: Secondary | ICD-10-CM

## 2018-05-06 DIAGNOSIS — R293 Abnormal posture: Secondary | ICD-10-CM

## 2018-05-06 NOTE — Therapy (Signed)
[place], [place], [address] Phone: [phone]   Fax:  [phone]  Physical Therapy Treatment  Patient Details  Name: Troy Wilkerson MRN: [id number] Date of Birth: [birth year] Referring Provider (PT): Dr Flossie Dibble    Encounter Date: 05/06/2018  PT End of Session - 05/06/18 0843    Visit Number  6    Number of Visits  12    Date for PT Re-Evaluation  05/26/18    PT Start Time  0802    PT Stop Time  0853    PT Time Calculation (min)  51 min    Activity Tolerance  Patient tolerated treatment well    Behavior During Therapy  [facility] for tasks assessed/performed       Past Medical History:  Diagnosis Date  . Anxiety   . Benign tumor of thoracic site    10 mos old  . Depression   . Obesity     Past Surgical History:  Procedure Laterality Date  . KNEE ARTHROSCOPY     left 2011  . THORACOTOMY     6 mos old    There were no vitals filed for this visit.  Subjective Assessment - 05/06/18 0804    Subjective  doing well, sometimes the knee feels normal and other times has pain at medial joint line and distal patella.    Pertinent History  Intermittent Lt mid back pain; Rt knee scope ~ 10 yrs ago    Patient Stated Goals  be able play with kids and return to work and recreational activities     Currently in Pain?  No/denies                       [facility] Adult PT Treatment/Exercise - 05/06/18 0805      Knee/Hip Exercises: Stretches   Passive Hamstring Stretch  Left;3 reps;30 seconds   supine with strap    Quad Stretch  Left;3 reps;30 seconds   prone with strap   Gastroc Stretch  Both;3 reps;30 seconds      Knee/Hip Exercises: Aerobic   Recumbent Bike  L1: 7 min for warm up and ROM.       Knee/Hip Exercises: Machines for Strengthening   Total Gym Leg Press  5 plates 3x10; cues for using LLE more than RLE      Knee/Hip Exercises: Standing   Heel Raises  Left;20 reps       Knee/Hip Exercises: Prone   Straight Leg Raises  Left;15 reps    Straight Leg Raises Limitations  3#    Other Prone Exercises  quad set 15x5 sec; left    Other Prone Exercises  hip ir/er with green theraband x 15; left      Vasopneumatic   Number Minutes Vasopneumatic   10 minutes    Vasopnuematic Location   Knee    Vasopneumatic Pressure  Medium    Vasopneumatic Temperature   34 deg                   PT Long Term Goals - 04/14/18 1036      PT LONG TERM GOAL #1   Title  increase ROM Lt knee to within 2-5 degrees of ROM Rt knee 05/26/18    Time  6    Period  Weeks    Status  New      PT LONG TERM GOAL #2   Title  5/5 strength Lt LE  05/26/18    Time  6    Period  Weeks    Status  New      PT LONG TERM GOAL #3   Title  ambulate without assistive device with normal gait pattern 05/26/18     Time  6    Period  Weeks    Status  New      PT LONG TERM GOAL #4   Title  Independent in HEP 05/26/18    Time  6    Period  Weeks    Status  New      PT LONG TERM GOAL #5   Title  mprove FOTO to </= 40% limitation 05/26/18    Time  6    Period  Weeks    Status  New            Plan - 05/06/18 5784    Clinical Impression Statement  Pt with improved gait mechanics today without cues needed.  Overall tolerated strengthening exercises well.  Progressing well towards goals.    Rehab Potential  Good    PT Frequency  2x / week    PT Duration  6 weeks    PT Treatment/Interventions  Patient/family education;ADLs/Self Care Home Management;Cryotherapy;Electrical Stimulation;Iontophoresis 4mg /ml Dexamethasone;Moist Heat;Ultrasound;Scar mobilization;Passive range of motion;Balance training;Manual techniques;Therapeutic activities;Therapeutic exercise;Gait training    PT Next Visit Plan  focus on extension, maintain flexion gains.  needs eccentric quad control and closed chain strengthening    PT Home Exercise Plan  Access Code: [email or web address]    Consulted and Agree with Plan of Care  Patient        Patient will benefit from skilled therapeutic intervention in order to improve the following deficits and impairments:  Postural dysfunction, Improper body mechanics, Increased fascial restricitons, Hypomobility, Decreased mobility, Decreased range of motion, Decreased strength, Abnormal gait, Decreased activity tolerance  Visit Diagnosis: Acute pain of left knee  Stiffness due to immobility  Abnormal posture  Other symptoms and signs involving the musculoskeletal system     Problem List Patient Active Problem List   Diagnosis Date Noted  . Rotavirus enteritis 10/01/2017  . Encounter for nonprocreative genetic counseling and testing 09/13/2017  . Regurgitation of food 07/26/2017  . Hypertension goal BP (blood pressure) < 130/80 07/26/2017  . Lumbar spinal stenosis 07/24/2017  . Encounter for medication management 06/28/2017  . Habitual snoring 06/28/2017  . Long uvula 06/28/2017  . Excessive daytime sleepiness 06/28/2017  . Weight gain due to medication 05/31/2017  . Class 1 obesity due to excess calories without serious comorbidity in adult 05/31/2017  . Chronic non-seasonal allergic rhinitis 04/22/2017  . Nonintractable episodic headache 04/17/2017  . Epigastric pain 04/11/2017  . Non-intractable cyclical vomiting with nausea 02/28/2017  . Dyspepsia 02/28/2017  . Nausea 02/07/2017  . Paronychia of finger of right hand 02/07/2017  . Anxiety associated with depression 01/16/2017  . Bone lesion 01/10/2017  . Low libido 01/10/2017  . Tennis elbow 12/27/2016  . Knee pain, bilateral 12/27/2016  . Depression, major, single episode, moderate ([place]) 12/27/2016      Laureen Abrahams, PT, DPT 05/06/18 8:45 AM    [facility] [place], [place], [address] Phone: [phone]   Fax:  [phone]  Name: Caylin Raby MRN: [id number] Date of Birth: 1980/08/21

## 2018-05-09 ENCOUNTER — Ambulatory Visit: Payer: BLUE CROSS/BLUE SHIELD | Admitting: Physical Therapy

## 2018-05-09 ENCOUNTER — Encounter: Payer: Self-pay | Admitting: Physical Therapy

## 2018-05-09 DIAGNOSIS — Z7409 Other reduced mobility: Secondary | ICD-10-CM

## 2018-05-09 DIAGNOSIS — R293 Abnormal posture: Secondary | ICD-10-CM

## 2018-05-09 DIAGNOSIS — R29898 Other symptoms and signs involving the musculoskeletal system: Secondary | ICD-10-CM | POA: Diagnosis not present

## 2018-05-09 DIAGNOSIS — M25562 Pain in left knee: Secondary | ICD-10-CM

## 2018-05-09 DIAGNOSIS — M2560 Stiffness of unspecified joint, not elsewhere classified: Secondary | ICD-10-CM | POA: Diagnosis not present

## 2018-05-09 NOTE — Therapy (Signed)
[place], [place], [address] Phone: [phone]   Fax:  [phone]  Physical Therapy Treatment  Patient Details  Name: Troy Wilkerson MRN: [id number] Date of Birth: [birth year] Referring Provider (PT): Dr Flossie Dibble    Encounter Date: 05/09/2018  PT End of Session - 05/09/18 0843    Visit Number  7    Number of Visits  12    Date for PT Re-Evaluation  05/26/18    PT Start Time  0803    PT Stop Time  0853    PT Time Calculation (min)  50 min    Activity Tolerance  Patient tolerated treatment well    Behavior During Therapy  [facility] for tasks assessed/performed       Past Medical History:  Diagnosis Date  . Anxiety   . Benign tumor of thoracic site    68 mos old  . Depression   . Obesity     Past Surgical History:  Procedure Laterality Date  . KNEE ARTHROSCOPY     left 2011  . THORACOTOMY     6 mos old    There were no vitals filed for this visit.  Subjective Assessment - 05/09/18 0805    Subjective  knee is consistent; still has occasional episodes of pain (inside right area of knee-no consistent pattern to pain); goes ~ 70% of the day without pain    Pertinent History  Intermittent Lt mid back pain; Rt knee scope ~ 10 yrs ago    Patient Stated Goals  be able play with kids and return to work and recreational activities     Currently in Pain?  No/denies         [facility] PT Assessment - 05/09/18 3810      Assessment   Medical Diagnosis  Lt knee scope     Referring Provider (PT)  Dr Flossie Dibble     Onset Date/Surgical Date  04/09/18    Next MD Visit  05/22/18      AROM   Left Knee Extension  0    Left Knee Flexion  135                   [facility] Adult PT Treatment/Exercise - 05/09/18 0806      Knee/Hip Exercises: Stretches   Passive Hamstring Stretch  Left;3 reps;30 seconds   supine with strap    Quad Stretch  Left;3 reps;30 seconds   prone with strap     Knee/Hip  Exercises: Aerobic   Elliptical  L2.5 x 5 min      Knee/Hip Exercises: Machines for Strengthening   Total Gym Leg Press  6 plates 3x10; cues for using LLE more than RLE      Knee/Hip Exercises: Standing   Wall Squat  10 reps;10 seconds    Wall Squat Limitations  with Rt heel raise during hold    Other Standing Knee Exercises  single limb dead lift to 3 targets x 5 reps      Knee/Hip Exercises: Supine   Quad Sets  Strengthening;Left;15 reps   heel on green noodle; 10 sec hold     Vasopneumatic   Number Minutes Vasopneumatic   10 minutes    Vasopnuematic Location   Knee    Vasopneumatic Pressure  Medium    Vasopneumatic Temperature   34 deg       Manual Therapy   Passive ROM  Lt knee extension with end range holds  PT Long Term Goals - 05/09/18 0844      PT LONG TERM GOAL #1   Title  increase ROM Lt knee to within 2-5 degrees of ROM Rt knee 05/26/18    Status  Achieved      PT LONG TERM GOAL #2   Title  5/5 strength Lt LE 05/26/18    Status  On-going      PT LONG TERM GOAL #3   Title  ambulate without assistive device with normal gait pattern 05/26/18     Status  On-going      PT LONG TERM GOAL #4   Title  Independent in HEP 05/26/18    Status  On-going      PT LONG TERM GOAL #5   Title  mprove FOTO to </= 40% limitation 05/26/18    Status  On-going            Plan - 05/09/18 0844    Clinical Impression Statement  Pt tolerated session well today with ROM goal met.  Progressing well with PT.    Rehab Potential  Good    PT Frequency  2x / week    PT Duration  6 weeks    PT Treatment/Interventions  Patient/family education;ADLs/Self Care Home Management;Cryotherapy;Electrical Stimulation;Iontophoresis 54m/ml Dexamethasone;Moist Heat;Ultrasound;Scar mobilization;Passive range of motion;Balance training;Manual techniques;Therapeutic activities;Therapeutic exercise;Gait training    PT Next Visit Plan  maintain ROM,  needs eccentric quad control  and closed chain strengthening    PT Home Exercise Plan  Access Code: [email or web address]   Consulted and Agree with Plan of Care  Patient       Patient will benefit from skilled therapeutic intervention in order to improve the following deficits and impairments:  Postural dysfunction, Improper body mechanics, Increased fascial restricitons, Hypomobility, Decreased mobility, Decreased range of motion, Decreased strength, Abnormal gait, Decreased activity tolerance  Visit Diagnosis: Acute pain of left knee  Stiffness due to immobility  Abnormal posture  Other symptoms and signs involving the musculoskeletal system     Problem List Patient Active Problem List   Diagnosis Date Noted  . Rotavirus enteritis 10/01/2017  . Encounter for nonprocreative genetic counseling and testing 09/13/2017  . Regurgitation of food 07/26/2017  . Hypertension goal BP (blood pressure) < 130/80 07/26/2017  . Lumbar spinal stenosis 07/24/2017  . Encounter for medication management 06/28/2017  . Habitual snoring 06/28/2017  . Long uvula 06/28/2017  . Excessive daytime sleepiness 06/28/2017  . Weight gain due to medication 05/31/2017  . Class 1 obesity due to excess calories without serious comorbidity in adult 05/31/2017  . Chronic non-seasonal allergic rhinitis 04/22/2017  . Nonintractable episodic headache 04/17/2017  . Epigastric pain 04/11/2017  . Non-intractable cyclical vomiting with nausea 02/28/2017  . Dyspepsia 02/28/2017  . Nausea 02/07/2017  . Paronychia of finger of right hand 02/07/2017  . Anxiety associated with depression 01/16/2017  . Bone lesion 01/10/2017  . Low libido 01/10/2017  . Tennis elbow 12/27/2016  . Knee pain, bilateral 12/27/2016  . Depression, major, single episode, moderate ([place] 12/27/2016      SLaureen Abrahams PT, DPT 05/09/18 8:45 AM    [place] [address]Phone:  [phone]  Fax:  [phone] Name: Troy StellyMRN: [id number] of Birth: [birth year]

## 2018-05-13 ENCOUNTER — Encounter: Payer: Self-pay | Admitting: Physical Therapy

## 2018-05-13 ENCOUNTER — Ambulatory Visit: Payer: BLUE CROSS/BLUE SHIELD | Admitting: Physical Therapy

## 2018-05-13 DIAGNOSIS — R293 Abnormal posture: Secondary | ICD-10-CM

## 2018-05-13 DIAGNOSIS — R29898 Other symptoms and signs involving the musculoskeletal system: Secondary | ICD-10-CM | POA: Diagnosis not present

## 2018-05-13 DIAGNOSIS — M25562 Pain in left knee: Secondary | ICD-10-CM | POA: Diagnosis not present

## 2018-05-13 DIAGNOSIS — Z7409 Other reduced mobility: Secondary | ICD-10-CM

## 2018-05-13 DIAGNOSIS — M2560 Stiffness of unspecified joint, not elsewhere classified: Secondary | ICD-10-CM

## 2018-05-13 NOTE — Therapy (Signed)
[place], [place], [address] Phone: [phone]   Fax:  [phone]  Physical Therapy Treatment  Patient Details  Name: Troy Wilkerson MRN: [id number] Date of Birth: 1980/12/10 Referring Provider (PT): Dr Flossie Dibble    Encounter Date: 05/13/2018  PT End of Session - 05/13/18 0840    Visit Number  8    Number of Visits  12    Date for PT Re-Evaluation  05/26/18    PT Start Time  0800    PT Stop Time  0841    PT Time Calculation (min)  41 min    Activity Tolerance  Patient tolerated treatment well    Behavior During Therapy  [facility] for tasks assessed/performed       Past Medical History:  Diagnosis Date  . Anxiety   . Benign tumor of thoracic site    68 mos old  . Depression   . Obesity     Past Surgical History:  Procedure Laterality Date  . KNEE ARTHROSCOPY     left 2011  . THORACOTOMY     6 mos old    There were no vitals filed for this visit.  Subjective Assessment - 05/13/18 0802    Subjective  knee has been more sore past couple days; reports increased sitting over weekend    Pertinent History  Intermittent Lt mid back pain; Rt knee scope ~ 10 yrs ago    Patient Stated Goals  be able play with kids and return to work and recreational activities     Currently in Pain?  No/denies                       [facility] Adult PT Treatment/Exercise - 05/13/18 0803      Knee/Hip Exercises: Stretches   Passive Hamstring Stretch  Left;3 reps;30 seconds   supine with strap    Quad Stretch  Left;3 reps;30 seconds   prone with strap     Knee/Hip Exercises: Aerobic   Recumbent Bike  L3 x 8 min      Knee/Hip Exercises: Machines for Strengthening   Cybex Knee Extension  LLE only: 2 plates 3x10; focus on eccentric control (5-10 sec to lower)    Total Gym Leg Press  6 plates 3x10; LLE only      Knee/Hip Exercises: Standing   SLS  LLE with mini squat to 12:00/3:00/6:00 x 5 reps each  direction; min cues for technique with squat; single limb deadlift to 11:00/12:00/1:00 x 5 reps each direction    Rebounder  lateral jumping, scissors and jumping jack with min c/o soreness      Knee/Hip Exercises: Seated   Stool Scoot - Round Trips  80' x 2      Knee/Hip Exercises: Supine   Single Leg Bridge  Left;2 sets;10 reps   up with both; Rt march; down with both   Other Supine Knee/Hip Exercises  bridge with hamstring curl 2x10 on green physioball                  PT Long Term Goals - 05/09/18 0844      PT LONG TERM GOAL #1   Title  increase ROM Lt knee to within 2-5 degrees of ROM Rt knee 05/26/18    Status  Achieved      PT LONG TERM GOAL #2   Title  5/5 strength Lt LE 05/26/18    Status  On-going  PT LONG TERM GOAL #3   Title  ambulate without assistive device with normal gait pattern 05/26/18     Status  On-going      PT LONG TERM GOAL #4   Title  Independent in HEP 05/26/18    Status  On-going      PT LONG TERM GOAL #5   Title  mprove FOTO to </= 40% limitation 05/26/18    Status  On-going            Plan - 05/13/18 0840    Clinical Impression Statement  Pt tolerated strengthening session well, noticable quad weakness with activities.  Progressing well towards goals.    Rehab Potential  Good    PT Frequency  2x / week    PT Duration  6 weeks    PT Treatment/Interventions  Patient/family education;ADLs/Self Care Home Management;Cryotherapy;Electrical Stimulation;Iontophoresis 4mg /ml Dexamethasone;Moist Heat;Ultrasound;Scar mobilization;Passive range of motion;Balance training;Manual techniques;Therapeutic activities;Therapeutic exercise;Gait training    PT Next Visit Plan  maintain ROM,  needs eccentric quad control and closed chain strengthening    PT Home Exercise Plan  Access Code: [email or web address]    Consulted and Agree with Plan of Care  Patient       Patient will benefit from skilled therapeutic intervention in order to improve the following  deficits and impairments:  Postural dysfunction, Improper body mechanics, Increased fascial restricitons, Hypomobility, Decreased mobility, Decreased range of motion, Decreased strength, Abnormal gait, Decreased activity tolerance  Visit Diagnosis: Acute pain of left knee  Stiffness due to immobility  Abnormal posture  Other symptoms and signs involving the musculoskeletal system     Problem List Patient Active Problem List   Diagnosis Date Noted  . Rotavirus enteritis 10/01/2017  . Encounter for nonprocreative genetic counseling and testing 09/13/2017  . Regurgitation of food 07/26/2017  . Hypertension goal BP (blood pressure) < 130/80 07/26/2017  . Lumbar spinal stenosis 07/24/2017  . Encounter for medication management 06/28/2017  . Habitual snoring 06/28/2017  . Long uvula 06/28/2017  . Excessive daytime sleepiness 06/28/2017  . Weight gain due to medication 05/31/2017  . Class 1 obesity due to excess calories without serious comorbidity in adult 05/31/2017  . Chronic non-seasonal allergic rhinitis 04/22/2017  . Nonintractable episodic headache 04/17/2017  . Epigastric pain 04/11/2017  . Non-intractable cyclical vomiting with nausea 02/28/2017  . Dyspepsia 02/28/2017  . Nausea 02/07/2017  . Paronychia of finger of right hand 02/07/2017  . Anxiety associated with depression 01/16/2017  . Bone lesion 01/10/2017  . Low libido 01/10/2017  . Tennis elbow 12/27/2016  . Knee pain, bilateral 12/27/2016  . Depression, major, single episode, moderate ([place]) 12/27/2016      Laureen Abrahams, PT, DPT 05/13/18 8:42 AM     [facility] [place], [place], [address] Phone: [phone]   Fax:  [phone]  Name: Troy Wilkerson MRN: [id number] Date of Birth: 08-29-1980

## 2018-05-16 ENCOUNTER — Ambulatory Visit (INDEPENDENT_AMBULATORY_CARE_PROVIDER_SITE_OTHER): Payer: BLUE CROSS/BLUE SHIELD | Admitting: Physical Therapy

## 2018-05-16 ENCOUNTER — Encounter: Payer: Self-pay | Admitting: Physical Therapy

## 2018-05-16 DIAGNOSIS — R29898 Other symptoms and signs involving the musculoskeletal system: Secondary | ICD-10-CM | POA: Diagnosis not present

## 2018-05-16 DIAGNOSIS — M2560 Stiffness of unspecified joint, not elsewhere classified: Secondary | ICD-10-CM

## 2018-05-16 DIAGNOSIS — M25562 Pain in left knee: Secondary | ICD-10-CM | POA: Diagnosis not present

## 2018-05-16 DIAGNOSIS — R293 Abnormal posture: Secondary | ICD-10-CM

## 2018-05-16 DIAGNOSIS — Z7409 Other reduced mobility: Secondary | ICD-10-CM

## 2018-05-16 NOTE — Therapy (Signed)
[place], [place], [address] Phone: [phone]   Fax:  [phone]  Physical Therapy Treatment  Patient Details  Name: Troy Wilkerson MRN: [id number] Date of Birth: 11-30-1980 Referring Provider (PT): Dr Flossie Dibble    Encounter Date: 05/16/2018  PT End of Session - 05/16/18 0842    Visit Number  9    Number of Visits  12    Date for PT Re-Evaluation  05/26/18    PT Start Time  0802    PT Stop Time  0852    PT Time Calculation (min)  50 min    Activity Tolerance  Patient tolerated treatment well    Behavior During Therapy  [facility] for tasks assessed/performed       Past Medical History:  Diagnosis Date  . Anxiety   . Benign tumor of thoracic site    18 mos old  . Depression   . Obesity     Past Surgical History:  Procedure Laterality Date  . KNEE ARTHROSCOPY     left 2011  . THORACOTOMY     6 mos old    There were no vitals filed for this visit.  Subjective Assessment - 05/16/18 0804    Subjective  knee is more sore than ususal.  maybe overdid it last session.    Patient Stated Goals  be able play with kids and return to work and recreational activities     Currently in Pain?  Yes    Pain Score  0-No pain   up to 7/10 after last session   Pain Location  Knee    Pain Orientation  Left    Pain Descriptors / Indicators  Dull    Pain Type  Chronic pain;Surgical pain    Pain Onset  In the past 7 days    Pain Frequency  Intermittent    Aggravating Factors   moving knee, walking; prolonged sitting    Pain Relieving Factors  rest         [facility] PT Assessment - 05/16/18 0831      Assessment   Medical Diagnosis  Lt knee scope     Referring Provider (PT)  Dr Flossie Dibble     Onset Date/Surgical Date  04/09/18    Next MD Visit  05/22/18                   [facility] Adult PT Treatment/Exercise - 05/16/18 0805      Knee/Hip Exercises: Stretches   Passive Hamstring Stretch   Left;3 reps;30 seconds   supine with strap   Quad Stretch  Left;3 reps;30 seconds   prone with strap     Knee/Hip Exercises: Aerobic   Recumbent Bike  L3 x 8 min      Knee/Hip Exercises: Machines for Strengthening   Cybex Knee Extension  LLE only: 2 plates 3x10; focus on eccentric control (5-10 sec to lower)      Knee/Hip Exercises: Standing   Heel Raises  Left;20 reps    Step Down  Right;20 reps;Hand Hold: 0;Step Height: 6"   Rt heel first contact; intermittent UE support   SLS  single limb deadlift to 11:00/12:00/1:00 x 5 reps each direction    Other Standing Knee Exercises  sidestepping and braiding with and without hop with min cues for technique      Knee/Hip Exercises: Supine   Single Leg Bridge  Left;10 reps   up with both; Rt march; down with  both     Vasopneumatic   Number Minutes Vasopneumatic   10 minutes    Vasopnuematic Location   Knee    Vasopneumatic Pressure  Medium    Vasopneumatic Temperature   34 deg                   PT Long Term Goals - 05/09/18 0844      PT LONG TERM GOAL #1   Title  increase ROM Lt knee to within 2-5 degrees of ROM Rt knee 05/26/18    Status  Achieved      PT LONG TERM GOAL #2   Title  5/5 strength Lt LE 05/26/18    Status  On-going      PT LONG TERM GOAL #3   Title  ambulate without assistive device with normal gait pattern 05/26/18     Status  On-going      PT LONG TERM GOAL #4   Title  Independent in HEP 05/26/18    Status  On-going      PT LONG TERM GOAL #5   Title  mprove FOTO to </= 40% limitation 05/26/18    Status  On-going            Plan - 05/16/18 0842    Clinical Impression Statement  Pt progressing well with PT at this time, and reported increased soreness with strengthening last session.  Continues to progress well with PT.    Rehab Potential  Good    PT Frequency  2x / week    PT Duration  6 weeks    PT Treatment/Interventions  Patient/family education;ADLs/Self Care Home  Management;Cryotherapy;Electrical Stimulation;Iontophoresis 4mg /ml Dexamethasone;Moist Heat;Ultrasound;Scar mobilization;Passive range of motion;Balance training;Manual techniques;Therapeutic activities;Therapeutic exercise;Gait training    PT Next Visit Plan  maintain ROM,  needs eccentric quad control and closed chain strengthening    PT Home Exercise Plan  Access Code: [email or web address]    Consulted and Agree with Plan of Care  Patient       Patient will benefit from skilled therapeutic intervention in order to improve the following deficits and impairments:  Postural dysfunction, Improper body mechanics, Increased fascial restricitons, Hypomobility, Decreased mobility, Decreased range of motion, Decreased strength, Abnormal gait, Decreased activity tolerance  Visit Diagnosis: Acute pain of left knee  Stiffness due to immobility  Abnormal posture  Other symptoms and signs involving the musculoskeletal system     Problem List Patient Active Problem List   Diagnosis Date Noted  . Rotavirus enteritis 10/01/2017  . Encounter for nonprocreative genetic counseling and testing 09/13/2017  . Regurgitation of food 07/26/2017  . Hypertension goal BP (blood pressure) < 130/80 07/26/2017  . Lumbar spinal stenosis 07/24/2017  . Encounter for medication management 06/28/2017  . Habitual snoring 06/28/2017  . Long uvula 06/28/2017  . Excessive daytime sleepiness 06/28/2017  . Weight gain due to medication 05/31/2017  . Class 1 obesity due to excess calories without serious comorbidity in adult 05/31/2017  . Chronic non-seasonal allergic rhinitis 04/22/2017  . Nonintractable episodic headache 04/17/2017  . Epigastric pain 04/11/2017  . Non-intractable cyclical vomiting with nausea 02/28/2017  . Dyspepsia 02/28/2017  . Nausea 02/07/2017  . Paronychia of finger of right hand 02/07/2017  . Anxiety associated with depression 01/16/2017  . Bone lesion 01/10/2017  . Low libido 01/10/2017  . Tennis  elbow 12/27/2016  . Knee pain, bilateral 12/27/2016  . Depression, major, single episode, moderate ([place]) 12/27/2016      Laureen Abrahams, PT, DPT 05/16/18 8:45  [place], [place], [address] Phone: [phone]   Fax:  [phone]  Name: Tedrick Port MRN: [id number] Date of Birth: Aug 15, 1980

## 2018-05-19 ENCOUNTER — Ambulatory Visit: Payer: BLUE CROSS/BLUE SHIELD | Admitting: Physical Therapy

## 2018-05-19 ENCOUNTER — Encounter: Payer: Self-pay | Admitting: Physical Therapy

## 2018-05-19 DIAGNOSIS — R293 Abnormal posture: Secondary | ICD-10-CM

## 2018-05-19 DIAGNOSIS — R29898 Other symptoms and signs involving the musculoskeletal system: Secondary | ICD-10-CM

## 2018-05-19 DIAGNOSIS — M2560 Stiffness of unspecified joint, not elsewhere classified: Secondary | ICD-10-CM

## 2018-05-19 DIAGNOSIS — Z7409 Other reduced mobility: Secondary | ICD-10-CM

## 2018-05-19 DIAGNOSIS — M25562 Pain in left knee: Secondary | ICD-10-CM | POA: Diagnosis not present

## 2018-05-19 NOTE — Therapy (Signed)
[place], [place], [address] Phone: [phone]   Fax:  [phone]  Physical Therapy Treatment  Patient Details  Name: Troy Wilkerson MRN: [id number] Date of Birth: 01/04/1981 Referring Provider (PT): Dr Flossie Dibble    Encounter Date: 05/19/2018  PT End of Session - 05/19/18 0842    Visit Number  10    Number of Visits  12    Date for PT Re-Evaluation  05/26/18    PT Start Time  0802    PT Stop Time  0852    PT Time Calculation (min)  50 min    Activity Tolerance  Patient tolerated treatment well    Behavior During Therapy  [facility] for tasks assessed/performed       Past Medical History:  Diagnosis Date  . Anxiety   . Benign tumor of thoracic site    22 mos old  . Depression   . Obesity     Past Surgical History:  Procedure Laterality Date  . KNEE ARTHROSCOPY     left 2011  . THORACOTOMY     6 mos old    There were no vitals filed for this visit.  Subjective Assessment - 05/19/18 0804    Subjective  has had increased pain after past 2 sessions, lasting ~ 24-36 hours.  pain is ant/medial    Patient Stated Goals  be able play with kids and return to work and recreational activities     Currently in Pain?  No/denies    Pain Score  0-No pain   c/o soreness currently        [facility] PT Assessment - 05/19/18 0820      Assessment   Medical Diagnosis  Lt knee scope     Referring Provider (PT)  Dr Flossie Dibble     Onset Date/Surgical Date  04/09/18    Next MD Visit  05/22/18      Observation/Other Assessments   Focus on Therapeutic Outcomes (FOTO)   62 (38% limited)                   [place] Adult PT Treatment/Exercise - 05/19/18 0805      Knee/Hip Exercises: Stretches   Passive Hamstring Stretch  Left;3 reps;30 seconds   supine with strap   Quad Stretch  Left;3 reps;30 seconds   prone with strap     Knee/Hip Exercises: Aerobic   Recumbent Bike  L3 x 8 min      Knee/Hip  Exercises: Standing   Heel Raises  Left;20 reps    Knee Flexion  Left;20 reps    Knee Flexion Limitations  5#    Hip Abduction  Left;20 reps;Knee straight    Abduction Limitations  5#    Hip Extension  Left;20 reps;Knee straight    Extension Limitations  5#    Other Standing Knee Exercises  step up and over 2x10 to 4" step      Knee/Hip Exercises: Seated   Long Arc Quad  Left;20 reps;Weights    Long Arc Quad Weight  5 lbs.    Long [employer] Limitations  with ball squeeze; focus on eccentric control      Vasopneumatic   Number Minutes Vasopneumatic   10 minutes    Vasopnuematic Location   Knee    Vasopneumatic Pressure  Medium    Vasopneumatic Temperature   34 deg  PT Long Term Goals - 05/19/18 0842      PT LONG TERM GOAL #1   Title  increase ROM Lt knee to within 2-5 degrees of ROM Rt knee 05/26/18    Status  Achieved      PT LONG TERM GOAL #2   Title  5/5 strength Lt LE 05/26/18    Status  On-going      PT LONG TERM GOAL #3   Title  ambulate without assistive device with normal gait pattern 05/26/18     Status  Achieved      PT LONG TERM GOAL #4   Title  Independent in HEP 05/26/18    Status  On-going      PT LONG TERM GOAL #5   Title  mprove FOTO to </= 40% limitation 05/26/18    Status  Achieved            Plan - 05/19/18 0842    Clinical Impression Statement  Pt has met 2 additional LTGs and is progressing well with PT.  Still with pain following eccentric quad exercises.  Pt to see MD this week; anticipate he will be ready for d/c.    Rehab Potential  Good    PT Frequency  2x / week    PT Duration  6 weeks    PT Treatment/Interventions  Patient/family education;ADLs/Self Care Home Management;Cryotherapy;Electrical Stimulation;Iontophoresis 10m/ml Dexamethasone;Moist Heat;Ultrasound;Scar mobilization;Passive range of motion;Balance training;Manual techniques;Therapeutic activities;Therapeutic exercise;Gait training    PT Next Visit Plan   needs MD note, continue strengthening    PT Home Exercise Plan  Access Code: [email or web address]    [email or web address] Agree with Plan of Care  Patient       Patient will benefit from skilled therapeutic intervention in order to improve the following deficits and impairments:  Postural dysfunction, Improper body mechanics, Increased fascial restricitons, Hypomobility, Decreased mobility, Decreased range of motion, Decreased strength, Abnormal gait, Decreased activity tolerance  Visit Diagnosis: Acute pain of left knee  Stiffness due to immobility  Abnormal posture  Other symptoms and signs involving the musculoskeletal system     Problem List Patient Active Problem List   Diagnosis Date Noted  . Rotavirus enteritis 10/01/2017  . Encounter for nonprocreative genetic counseling and testing 09/13/2017  . Regurgitation of food 07/26/2017  . Hypertension goal BP (blood pressure) < 130/80 07/26/2017  . Lumbar spinal stenosis 07/24/2017  . Encounter for medication management 06/28/2017  . Habitual snoring 06/28/2017  . Long uvula 06/28/2017  . Excessive daytime sleepiness 06/28/2017  . Weight gain due to medication 05/31/2017  . Class 1 obesity due to excess calories without serious comorbidity in adult 05/31/2017  . Chronic non-seasonal allergic rhinitis 04/22/2017  . Nonintractable episodic headache 04/17/2017  . Epigastric pain 04/11/2017  . Non-intractable cyclical vomiting with nausea 02/28/2017  . Dyspepsia 02/28/2017  . Nausea 02/07/2017  . Paronychia of finger of right hand 02/07/2017  . Anxiety associated with depression 01/16/2017  . Bone lesion 01/10/2017  . Low libido 01/10/2017  . Tennis elbow 12/27/2016  . Knee pain, bilateral 12/27/2016  . Depression, major, single episode, moderate ([place] 12/27/2016      SLaureen Abrahams PT, DPT 05/19/18 8:44 AM     [place]  [address]Phone: [phone]  Fax:  [phone] Name: Troy SuskiMRN: [id number] of Birth: [birth year]

## 2018-05-21 ENCOUNTER — Encounter: Payer: Self-pay | Admitting: Physical Therapy

## 2018-05-21 ENCOUNTER — Ambulatory Visit: Payer: BLUE CROSS/BLUE SHIELD | Admitting: Physical Therapy

## 2018-05-21 DIAGNOSIS — M25562 Pain in left knee: Secondary | ICD-10-CM

## 2018-05-21 DIAGNOSIS — R293 Abnormal posture: Secondary | ICD-10-CM

## 2018-05-21 DIAGNOSIS — Z7409 Other reduced mobility: Secondary | ICD-10-CM

## 2018-05-21 DIAGNOSIS — M2560 Stiffness of unspecified joint, not elsewhere classified: Secondary | ICD-10-CM | POA: Diagnosis not present

## 2018-05-21 DIAGNOSIS — R29898 Other symptoms and signs involving the musculoskeletal system: Secondary | ICD-10-CM

## 2018-05-21 NOTE — Therapy (Addendum)
[place], [place], [address] Phone: [phone]   Fax:  [phone]  Physical Therapy Treatment/Discharge  Patient Details  Name: Troy Wilkerson MRN: [id number] Date of Birth: 06-30-1980 Referring Provider (PT): Dr Flossie Dibble    Encounter Date: 05/21/2018  PT End of Session - 05/21/18 1022    Visit Number  11    Number of Visits  12    Date for PT Re-Evaluation  05/26/18    PT Start Time  0935   pt arrived late   PT Stop Time  1008    PT Time Calculation (min)  33 min    Activity Tolerance  Patient tolerated treatment well    Behavior During Therapy  [facility] for tasks assessed/performed       Past Medical History:  Diagnosis Date  . Anxiety   . Benign tumor of thoracic site    39 mos old  . Depression   . Obesity     Past Surgical History:  Procedure Laterality Date  . KNEE ARTHROSCOPY     left 2011  . THORACOTOMY     6 mos old    There were no vitals filed for this visit.  Subjective Assessment - 05/21/18 0938    Subjective  knee felt better after last session. still has a "pinching" sensation in Lt knee with "impact."    Patient Stated Goals  be able play with kids and return to work and recreational activities     Currently in Pain?  No/denies         [facility] PT Assessment - 05/21/18 0942      Assessment   Medical Diagnosis  Lt knee scope     Referring Provider (PT)  Dr Flossie Dibble     Onset Date/Surgical Date  04/09/18    Next MD Visit  05/22/18      Observation/Other Assessments   Focus on Therapeutic Outcomes (FOTO)   62 (38% limited)      AROM   Left Knee Extension  0    Left Knee Flexion  135      Strength   Strength Assessment Site  Knee    Right/Left Knee  Left    Left Knee Flexion  4/5    Left Knee Extension  5/5                   [facility] Adult PT Treatment/Exercise - 05/21/18 0939      Knee/Hip Exercises: Stretches   Passive Hamstring Stretch   Left;3 reps;30 seconds   supine with strap   Quad Stretch  Left;3 reps;30 seconds   prone with strap   Gastroc Stretch  Both;3 reps;30 seconds    Gastroc Stretch Limitations  slant board      Knee/Hip Exercises: Aerobic   Recumbent Bike  L3 x 8 min      Knee/Hip Exercises: Standing   Other Standing Knee Exercises  single limb deadlift 2x10      Knee/Hip Exercises: Supine   Single Leg Bridge  Left;2 sets;10 reps   up with both; Rt march with 3 sec hold; down with both            PT Education - 05/21/18 1022    Education Details  updated HEP    Person(s) Educated  Patient    Methods  Explanation;Demonstration;Handout    Comprehension  Verbalized understanding;Returned demonstration          PT Long Term  Goals - 05/21/18 1022      PT LONG TERM GOAL #1   Title  increase ROM Lt knee to within 2-5 degrees of ROM Rt knee 05/26/18    Status  Achieved      PT LONG TERM GOAL #2   Title  5/5 strength Lt LE 05/26/18    Baseline  Lt knee flexion 4/5; extension 5/5    Status  Partially Met      PT LONG TERM GOAL #3   Title  ambulate without assistive device with normal gait pattern 05/26/18     Status  Achieved      PT LONG TERM GOAL #4   Title  Independent in HEP 05/26/18    Status  Achieved      PT LONG TERM GOAL #5   Title  mprove FOTO to </= 40% limitation 05/26/18    Status  Achieved            Plan - 05/21/18 1023    Clinical Impression Statement  Pt has met all goals, except demonstrates mild Lt knee flexion strength at 4/5 with MMT.  Pt is pleased with progress and scheduled to follow up with MD tomorrow.  Plan for d/c today, but will await until after MD appt tomorrow to confirm transition to HEP.    Rehab Potential  Good    PT Frequency  2x / week    PT Duration  6 weeks    PT Treatment/Interventions  Patient/family education;ADLs/Self Care Home Management;Cryotherapy;Electrical Stimulation;Iontophoresis 58m/ml Dexamethasone;Moist Heat;Ultrasound;Scar  mobilization;Passive range of motion;Balance training;Manual techniques;Therapeutic activities;Therapeutic exercise;Gait training    PT Next Visit Plan  plan for d/c unless MD requests additional visits.  d/c v renew based on MD visit    PT Home Exercise Plan  Access Code: [email or web address]   Consulted and Agree with Plan of Care  Patient       Patient will benefit from skilled therapeutic intervention in order to improve the following deficits and impairments:  Postural dysfunction, Improper body mechanics, Increased fascial restricitons, Hypomobility, Decreased mobility, Decreased range of motion, Decreased strength, Abnormal gait, Decreased activity tolerance  Visit Diagnosis: Acute pain of left knee  Stiffness due to immobility  Abnormal posture  Other symptoms and signs involving the musculoskeletal system     Problem List Patient Active Problem List   Diagnosis Date Noted  . Rotavirus enteritis 10/01/2017  . Encounter for nonprocreative genetic counseling and testing 09/13/2017  . Regurgitation of food 07/26/2017  . Hypertension goal BP (blood pressure) < 130/80 07/26/2017  . Lumbar spinal stenosis 07/24/2017  . Encounter for medication management 06/28/2017  . Habitual snoring 06/28/2017  . Long uvula 06/28/2017  . Excessive daytime sleepiness 06/28/2017  . Weight gain due to medication 05/31/2017  . Class 1 obesity due to excess calories without serious comorbidity in adult 05/31/2017  . Chronic non-seasonal allergic rhinitis 04/22/2017  . Nonintractable episodic headache 04/17/2017  . Epigastric pain 04/11/2017  . Non-intractable cyclical vomiting with nausea 02/28/2017  . Dyspepsia 02/28/2017  . Nausea 02/07/2017  . Paronychia of finger of right hand 02/07/2017  . Anxiety associated with depression 01/16/2017  . Bone lesion 01/10/2017  . Low libido 01/10/2017  . Tennis elbow 12/27/2016  . Knee pain, bilateral 12/27/2016  . Depression, major, single episode, moderate  ([place] 12/27/2016       SLaureen Abrahams PT, DPT 05/21/18 10:25 AM    [facility] [address] [place] Phone: [phone]   Fax:  [phone]  Name: Donie Lemelin MRN: [id number] Date of Birth: 1980-11-20     PHYSICAL THERAPY DISCHARGE SUMMARY  Visits from Start of Care: 11  Current functional level related to goals / functional outcomes: See above   Remaining deficits: See above   Education / Equipment: HEP  Plan: Patient agrees to discharge.  Patient goals were met. Patient is being discharged due to meeting the stated rehab goals.  ?????     Laureen Abrahams, PT, DPT 06/24/18 2:30 PM  [facility] [place], [place] [address]  [phone] (office) [phone] (fax)

## 2018-05-21 NOTE — Patient Instructions (Signed)
Access Code: [email or web address]  URL: [email or web address]  Date: 05/21/2018  Prepared by: Faustino Congress   Exercises  Step Up - 10 reps - 2 sets - 1x daily - 7x weekly  Lateral Step Up - 10 reps - 2 sets - 1x daily - 7x weekly  Wall Quarter Squat - 10 reps - 2 sets - 5-10 sec hold - 1x daily - 7x weekly  Prone Quadriceps Stretch with Strap - 3 reps - 1 sets - 30 seconds hold - 1-2x daily - 7x weekly  Gastroc Stretch on Wall - 3 reps - 1 sets - 30 seconds hold - 1-2x daily - 7x weekly  Marching Bridge - 10 reps - 2 sets - 3-5 sec hold - 1x daily - 7x weekly  Forward T - 10 reps - 2 sets - 1x daily - 7x weekly  Patient Education  Scar Massage

## 2018-05-22 ENCOUNTER — Encounter: Payer: BLUE CROSS/BLUE SHIELD | Admitting: Physical Therapy

## 2018-05-22 DIAGNOSIS — S83242D Other tear of medial meniscus, current injury, left knee, subsequent encounter: Secondary | ICD-10-CM | POA: Diagnosis not present

## 2018-12-22 ENCOUNTER — Encounter: Payer: Self-pay | Admitting: Sports Medicine

## 2018-12-22 ENCOUNTER — Other Ambulatory Visit: Payer: Self-pay

## 2018-12-22 ENCOUNTER — Ambulatory Visit (INDEPENDENT_AMBULATORY_CARE_PROVIDER_SITE_OTHER): Payer: Self-pay | Admitting: Sports Medicine

## 2018-12-22 DIAGNOSIS — M48061 Spinal stenosis, lumbar region without neurogenic claudication: Secondary | ICD-10-CM

## 2018-12-22 MED ORDER — PREDNISONE 50 MG PO TABS: ORAL_TABLET | ORAL | 0 refills | Status: DC

## 2018-12-22 NOTE — Progress Notes (Signed)
Subjective:    CC: Follow-up  HPI: This is a pleasant 38 year old male, he has lumbar spinal stenosis without radiculopathy, we treated him with a left L4-L5 interlaminar epidural in August 2019 and he did well until last week.  Now has a recurrence of pain, axial, worse with standing, better with sitting, flexion, nothing radicular, no bowel or bladder dysfunction, saddle numbness, constitutional symptoms.  I reviewed the past medical history, family history, social history, surgical history, and allergies today and no changes were needed.  Please see the problem list section below in epic for further details.  Past Medical History: Past Medical History:  Diagnosis Date  . Anxiety   . Benign tumor of thoracic site    21 mos old  . Depression   . Obesity    Past Surgical History: Past Surgical History:  Procedure Laterality Date  . KNEE ARTHROSCOPY     left 2011  . THORACOTOMY     65 mos old   Social History: Social History   Socioeconomic History  . Marital status: Married    Spouse name: Not on file  . Number of children: Not on file  . Years of education: Not on file  . Highest education level: Not on file  Occupational History  . Not on file  Social Needs  . Financial resource strain: Not on file  . Food insecurity    Worry: Not on file    Inability: Not on file  . Transportation needs    Medical: Not on file    Non-medical: Not on file  Tobacco Use  . Smoking status: Never Smoker  . Smokeless tobacco: Never Used  Substance and Sexual Activity  . Alcohol use: No  . Drug use: No  . Sexual activity: Yes    Partners: Female    Birth control/protection: None  Lifestyle  . Physical activity    Days per week: Not on file    Minutes per session: Not on file  . Stress: Not on file  Relationships  . Social Herbalist on phone: Not on file    Gets together: Not on file    Attends religious service: Not on file    Active member of club or  organization: Not on file    Attends meetings of clubs or organizations: Not on file    Relationship status: Not on file  Other Topics Concern  . Not on file  Social History Narrative  . Not on file   Family History: Family History  Problem Relation Age of Onset  . Cancer Mother   . Heart disease Mother   . Diabetes Mother   . Stroke Brother    Allergies: Allergies  Allergen Reactions  . Cymbalta [Duloxetine Hcl] Nausea And Vomiting    GeneSight: significant gene-drug interaction  . Luvox [Fluvoxamine] Other (See Comments)    GeneSight: significant gene-drug interaction   Medications: See med rec.  Review of Systems: No fevers, chills, night sweats, weight loss, chest pain, or shortness of breath.   Objective:    General: Well Developed, well nourished, and in no acute distress.  Neuro: Alert and oriented x3, extra-ocular muscles intact, sensation grossly intact.  HEENT: Normocephalic, atraumatic, pupils equal round reactive to light, neck supple, no masses, no lymphadenopathy, thyroid nonpalpable.  Skin: Warm and dry, no rashes. Cardiac: Regular rate and rhythm, no murmurs rubs or gallops, no lower extremity edema.  Respiratory: Clear to auscultation bilaterally. Not using accessory muscles, speaking in full  sentences. Back Exam:  Inspection: Unremarkable  Motion: Flexion 45 deg, Extension 45 deg, Side Bending to 45 deg bilaterally,  Rotation to 45 deg bilaterally  SLR laying: Negative  XSLR laying: Negative  Palpable tenderness: None. FABER: negative. Sensory change: Gross sensation intact to all lumbar and sacral dermatomes.  Reflexes: 2+ at both patellar tendons, 2+ at achilles tendons, Babinski's downgoing.  Strength at foot  Plantar-flexion: 5/5 Dorsi-flexion: 5/5 Eversion: 5/5 Inversion: 5/5  Leg strength  Quad: 5/5 Hamstring: 5/5 Hip flexor: 5/5 Hip abductors: 5/5  Gait unremarkable.  Impression and Recommendations:    Lumbar spinal stenosis Fantastic  relief from the left L4-L5 interlaminar epidural about a year ago. Pain returned last week. Reordering of the epidural, though he does not have insurance so I am going to add some prednisone to use if needed. Certainly we could add some tramadol as well.   ___________________________________________ Gwen Her. Dianah Field, M.D., ABFM., CAQSM. Primary Care and Sports Medicine [facility]  Adjunct Professor of [place] [employer] [facility] [employer] [facility] [employer]

## 2018-12-22 NOTE — Assessment & Plan Note (Signed)
Fantastic relief from the left L4-L5 interlaminar epidural about a year ago. Pain returned last week. Reordering of the epidural, though he does not have insurance so I am going to add some prednisone to use if needed. Certainly we could add some tramadol as well.

## 2018-12-26 ENCOUNTER — Other Ambulatory Visit: Payer: Self-pay

## 2018-12-26 ENCOUNTER — Ambulatory Visit
Admission: RE | Admit: 2018-12-26 | Discharge: 2018-12-26 | Disposition: A | Discharge status: Home / Self Care | Payer: Self-pay | Source: Ambulatory Visit | Attending: Sports Medicine | Admitting: Sports Medicine

## 2018-12-26 MED ORDER — METHYLPREDNISOLONE ACETATE 40 MG/ML INJ SUSP (RADIOLOG
120.0000 mg | Freq: Once | INTRAMUSCULAR | Status: AC
  Administered 2018-12-26: 120 mg via EPIDURAL

## 2018-12-26 MED ORDER — IOPAMIDOL (ISOVUE-M 200) INJECTION 41%
1.0000 mL | Freq: Once | INTRAMUSCULAR | Status: AC
  Administered 2018-12-26: 1 mL via EPIDURAL

## 2018-12-26 NOTE — Discharge Instructions (Signed)

## 2018-12-29 IMAGING — XA Imaging study
1 series · 1 of 1 positions shown · non-contrast
Comparison: none

CLINICAL DATA: Lumbosacral spondylosis without myelopathy.
Degenerative disc disease L4-5. Left low back pain.

[Series 1: ortho adipose · 1 of 1 slices shown]
[im 1/1]
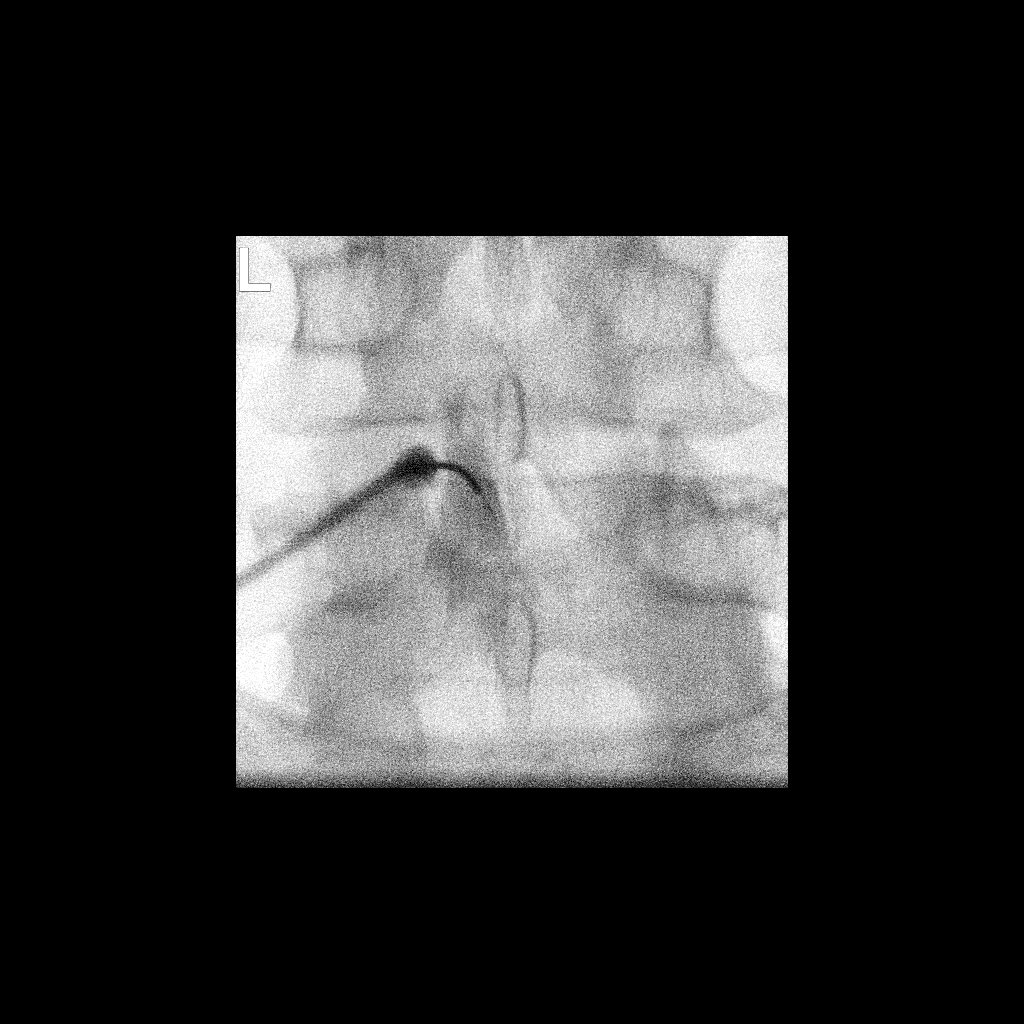

[1 of 1 positions shown; findings below may reference images not displayed]

PROCEDURE:
The procedure, risks, benefits, and alternatives were explained to
the patient. Questions regarding the procedure were encouraged and
answered. The patient understands and consents to the procedure.

LUMBAR EPIDURAL INJECTION:

An interlaminar approach was performed on the left at L4-5. The
overlying skin was cleansed and anesthetized. A 20 gauge epidural
needle was advanced using loss-of-resistance technique.

DIAGNOSTIC EPIDURAL INJECTION:

Injection of Isovue-M 200 shows a good epidural pattern with spread
above and below the level of needle placement, primarily on the left
no vascular opacification is seen.

THERAPEUTIC EPIDURAL INJECTION:

120 mg of Depo-Medrol mixed with lidocaine 1% 5 mL were instilled.
The procedure was well-tolerated, and the patient was discharged
thirty minutes following the injection in good condition.

FLUOROSCOPY TIME:  20 seconds; 26 uDym0 DAP

COMPLICATIONS:
None immediate
IMPRESSION: Technically successful epidural injection on the left at L4-5.

## 2020-01-05 IMAGING — XA Imaging study
2 series · 2 of 2 positions shown · non-contrast
Comparison: none

CLINICAL DATA: Spondylosis without myelopathy. 80% improvement from
previous lumbar epidural. Refer recurrence of symptoms in a similar
fashion.

[Series 1: ortho adipose · 1 of 1 slices shown (1 of 2)]
[im 1/1]
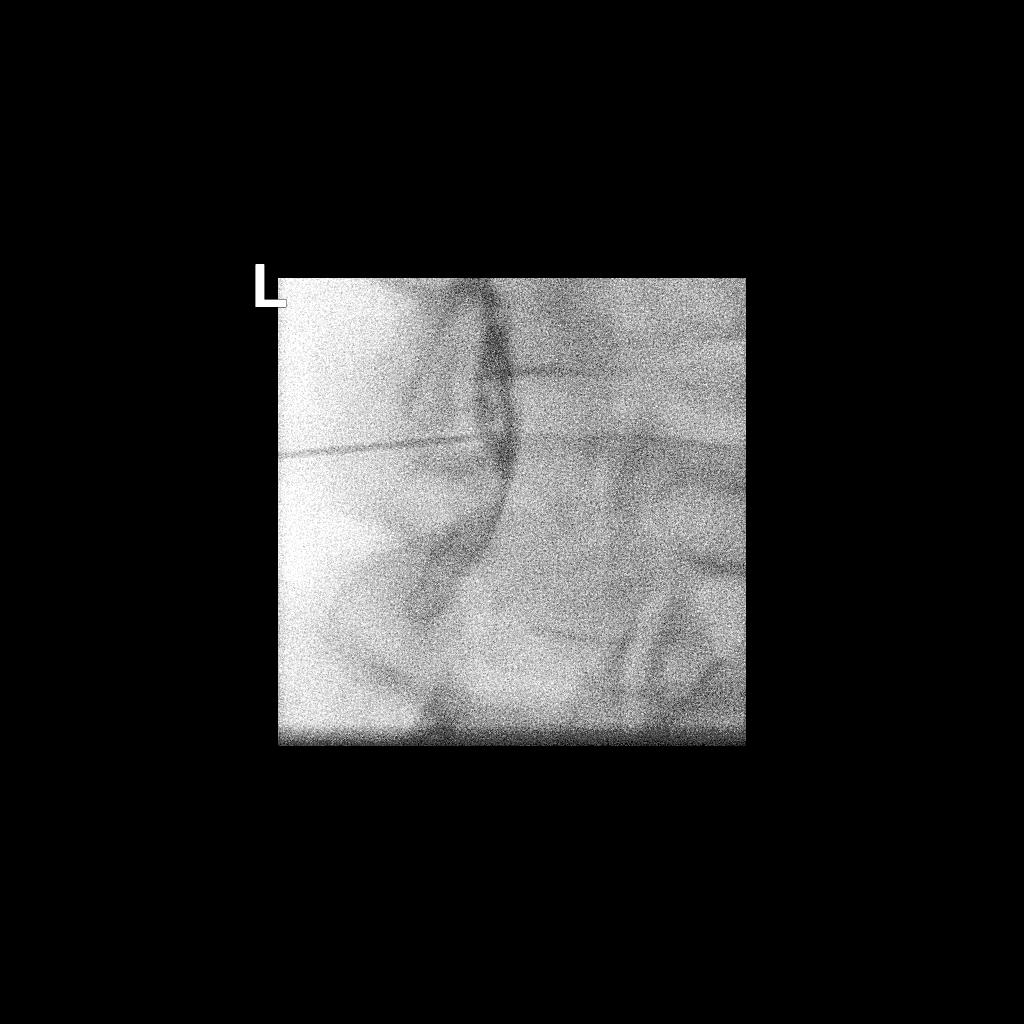

[Series 2: ortho adipose · 1 of 1 slices shown (2 of 2)]
[im 1/1]
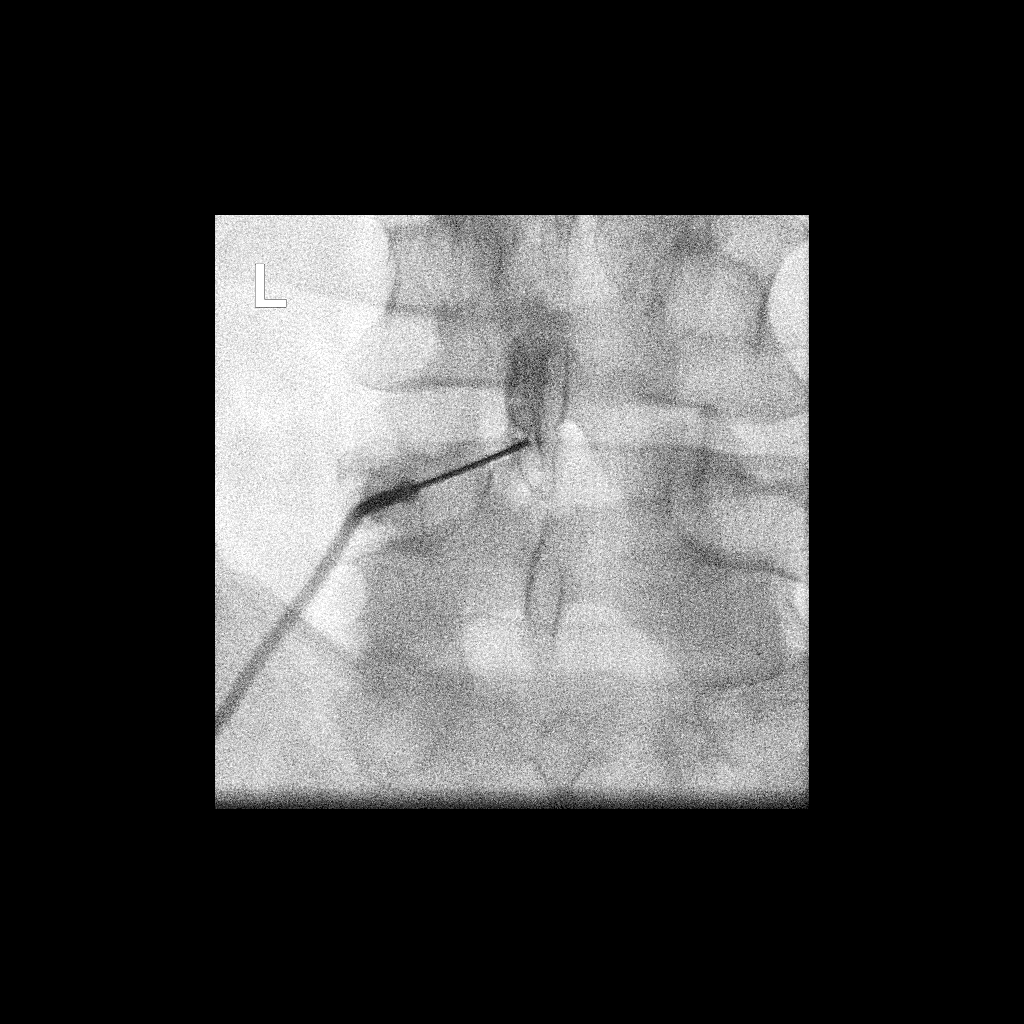

[2 of 2 positions shown; findings below may reference images not displayed]

FLUOROSCOPY TIME:  0 minutes 23 seconds. 21.58 micro gray meter
squared

PROCEDURE:
The procedure, risks, benefits, and alternatives were explained to
the patient. Questions regarding the procedure were encouraged and
answered. The patient understands and consents to the procedure.

LUMBAR EPIDURAL INJECTION:

An interlaminar approach was performed on the left at L4-5. The
overlying skin was cleansed and anesthetized. A 20 gauge epidural
needle was advanced using loss-of-resistance technique.

DIAGNOSTIC EPIDURAL INJECTION:

Injection of Isovue-M 200 shows a good epidural pattern with spread
above and below the level of needle placement, primarily on the
left. No vascular opacification is seen.

THERAPEUTIC EPIDURAL INJECTION:

One hundred twenty mg of Depo-Medrol mixed with 2.5 cc 1% lidocaine
were instilled. The procedure was well-tolerated, and the patient
was discharged thirty minutes following the injection in good
condition.

COMPLICATIONS:
None
IMPRESSION: Technically successful epidural injection on the left at L4-5.

## 2023-09-11 ENCOUNTER — Encounter: Payer: Self-pay | Admitting: Sports Medicine

## 2023-09-11 ENCOUNTER — Ambulatory Visit (INDEPENDENT_AMBULATORY_CARE_PROVIDER_SITE_OTHER): Payer: Self-pay | Admitting: Sports Medicine

## 2023-09-11 ENCOUNTER — Ambulatory Visit (INDEPENDENT_AMBULATORY_CARE_PROVIDER_SITE_OTHER)

## 2023-09-11 VITALS — BP 119/74 | HR 67 | Wt 243.0 lb

## 2023-09-11 DIAGNOSIS — M25552 Pain in left hip: Secondary | ICD-10-CM | POA: Diagnosis not present

## 2023-09-11 DIAGNOSIS — M25561 Pain in right knee: Secondary | ICD-10-CM

## 2023-09-11 DIAGNOSIS — M25562 Pain in left knee: Secondary | ICD-10-CM | POA: Diagnosis not present

## 2023-09-11 NOTE — Progress Notes (Signed)
    Procedures performed today:    None.  Independent interpretation of notes and tests performed by another provider:   None.  Brief History, Exam, Impression, and Recommendations:    Left hip pain Very pleasant 43 year old male, was lifting something at work approximately 6 weeks ago, immediate pain, weakness left groin. He was seen in the ED, x-rays were done that were negative. He is here for further evaluation and definitive treatment. He endorses pain in the left groin, on exam he does not have reproduction of pain with resisted flexion of the hip, he does have pain with internal rotation of the hip, he does have a positive FADIR sign. We did a hernia exam, there is no sign of direct or indirect inguinal hernia, femoral hernia, spigelian hernia. I do suspect we are dealing with a labral injury, considering greater than 6 weeks of conservative treatment without improvement we will proceed at this point with MR arthrography, I will see him back for the arthrogram injection. Additional hip conditioning given.    ____________________________________________ Joselyn Nicely. Sandy Crumb, M.D., ABFM., CAQSM., AME. Primary Care and Sports Medicine [place] [facility]  Adjunct Professor of [employer]  Restaurant manager, fast food

## 2023-09-11 NOTE — Assessment & Plan Note (Signed)
 Very pleasant 43 year old male, was lifting something at work approximately 6 weeks ago, immediate pain, weakness left groin. He was seen in the ED, x-rays were done that were negative. He is here for further evaluation and definitive treatment. He endorses pain in the left groin, on exam he does not have reproduction of pain with resisted flexion of the hip, he does have pain with internal rotation of the hip, he does have a positive FADIR sign. We did a hernia exam, there is no sign of direct or indirect inguinal hernia, femoral hernia, spigelian hernia. I do suspect we are dealing with a labral injury, considering greater than 6 weeks of conservative treatment without improvement we will proceed at this point with MR arthrography, I will see him back for the arthrogram injection. Additional hip conditioning given.

## 2023-09-11 NOTE — Addendum Note (Signed)
 Addended by: Gean Keels on: 09/11/2023 11:23 AM   Modules accepted: Orders

## 2023-09-15 ENCOUNTER — Ambulatory Visit: Payer: Self-pay | Admitting: Sports Medicine

## 2023-09-18 ENCOUNTER — Telehealth: Payer: Self-pay | Admitting: Sports Medicine

## 2023-09-18 NOTE — Telephone Encounter (Signed)
 Cannon Champion from the referral team is helping in getting reauthorization for MRI with intra-articular contrast. Patients' insurance would only cover through [facility] but, [facility] has only provided IV contrast in the past.

## 2023-09-18 NOTE — Telephone Encounter (Signed)
 Per radiologist protocol, the MRI order needs to be MRI Hip Left With and Without Contrast. Please fax an updated order to [phone]

## 2023-09-18 NOTE — Telephone Encounter (Signed)
 Just now reading this, please make sure that imaging makes notes to not give him any IV contrast.  This is all intra-articular contrast.

## 2023-09-18 NOTE — Telephone Encounter (Addendum)
 Per T's note I believe the MRI was ordered with contrast. His note states, "I will see him back for the arthrogram injection." I will follow-up with Dr. Elva Hamburger and confirm.

## 2023-09-24 ENCOUNTER — Ambulatory Visit: Payer: Self-pay

## 2023-09-24 NOTE — Telephone Encounter (Signed)
 Chief Complaint: Right side abdominal pain Symptoms: Right side abdominal pain and vomiting Frequency: since 5 am this morning Pertinent Negatives: Patient denies back pain, radiation of pain Disposition: [x] ED /[] Urgent Care (no appt availability in office) / [] Appointment(In office/virtual)/ []  [facility]/ [] Home Care/ [] Refused Recommended Disposition /[] [facility]/ []  Follow-up with PCP Additional Notes: Patient reporting right side abdominal pain above the hip bone since 5 am (woke him up) accompanied by vomiting. No known cause. Patient reports still having his appendix. Patient rates pain 9/10 with no relief. Patient advised to go to ED asap. Patient verbalized understanding.    Copied from CRM [id number]. Topic: Clinical - Red Word Triage >> Sep 24, 2023  9:17 AM Tisa Forester wrote: Red Word that prompted transfer to Nurse Triage: vomiting since 5 am this morning  severe pain on right side of stomach ,feel like a squeezing sensation patient had a hot dog with catsup and a buttercream cookies  patient call [phone] Reason for Disposition  [1] SEVERE pain (e.g., excruciating) AND [2] present > 1 hour  Answer Assessment - Initial Assessment Questions 1. LOCATION: "Where does it hurt?"      Right side - above the waist 2. RADIATION: "Does the pain shoot anywhere else?" (e.g., chest, back)     No 3. ONSET: "When did the pain begin?" (Minutes, hours or days ago)      5 am this morning 4. SUDDEN: "Gradual or sudden onset?"     Sudden onset - woke him up 5. PATTERN "Does the pain come and go, or is it constant?"    - If it comes and goes: "How long does it last?" "Do you have pain now?"     (Note: Comes and goes means the pain is intermittent. It goes away completely between bouts.)    - If constant: "Is it getting better, staying the same, or getting worse?"      (Note: Constant means the pain never goes away completely; most serious pain is constant and gets  worse.)      Constant 6. SEVERITY: "How bad is the pain?"  (e.g., Scale 1-10; mild, moderate, or severe)    - MILD (1-3): Doesn't interfere with normal activities, abdomen soft and not tender to touch.     - MODERATE (4-7): Interferes with normal activities or awakens from sleep, abdomen tender to touch.     - SEVERE (8-10): Excruciating pain, doubled over, unable to do any normal activities.       9 8. CAUSE: "What do you think is causing the stomach pain?"     No known cause 9. RELIEVING/AGGRAVATING FACTORS: "What makes it better or worse?" (e.g., antacids, bending or twisting motion, bowel movement)     No 10. OTHER SYMPTOMS: "Do you have any other symptoms?" (e.g., back pain, diarrhea, fever, urination pain, vomiting)       Vomiting, sweating  Protocols used: Abdominal Pain - Male-A-AH

## 2023-10-01 ENCOUNTER — Ambulatory Visit: Payer: Self-pay

## 2023-10-01 NOTE — Telephone Encounter (Signed)
 FYI Only or Action Required?: FYI only for provider  Patient was last seen in primary care on 09/11/2023 by Gean Keels, MD. Called Nurse Triage reporting Abdominal Pain. Symptoms began a week ago. Interventions attempted: Prescription medications: Norco, med for kidney stone. Symptoms are: unchanged.  Triage Disposition: See Physician Within 24 Hours  Patient/caregiver understands and will follow disposition?: Yes  Copied from CRM [id number]. Topic: Clinical - Red Word Triage >> Oct 01, 2023  1:12 PM Tisa Forester wrote: Red Word that prompted transfer to Nurse Triage:   spoke to a triage nurse on 09/24/23 having severe pain on right side of stomach ,feel like a squeezing sensation was disposition to go to ED patient did go and  diagnois having kidney stone  patient still having same symptoms with pain  was given pain medication have 3 left   patient call back  [phone] Reason for Disposition  [1] MODERATE pain (e.g., interferes with normal activities) AND [2] pain comes and goes AND [3] present > 24 hours  Answer Assessment - Initial Assessment Questions 1. MAIN CONCERN OR SYMPTOM:  "What is your main concern right now?" "What question do you have?" "What's the main symptom you're worried about?" (e.g., blood in urine, flank pain)     Right sided abdominal pain, known kidney stone 2. ONSET: "When did the  pain  start?"     09/24/2023 3. BETTER-SAME-WORSE: "Are you getting better, staying the same, or getting worse compared to how you felt at your last visit to the doctor (most recent medical visit)?"     The same, pain controlled with hydrocodone , but no change in symptoms 4. VISIT DATE: "When were you seen?" (Date)     09/24/2023 5. VISIT DOCTOR: "What is the name of the doctor (or NP/PA) taking care of you now?"     ER MD 6. VISIT DIAGNOSIS:  "What was the main symptom or problem that you were seen for?" "Were you given a diagnosis?"      Kidney stone 7. TREATMENT: "Did you have  any treatment for your kidney stone?" (e.g., none, doctor exam, lithotripsy, medicines, stent) If Yes, ask: "When did you have this treatment?"     Medicine to break up stone and hydrocodone  for pain 8. NEXT APPOINTMENT: "Do you have a follow-up appointment with your doctor?"     None scheduled 9. PAIN: "Is there any pain?" If Yes, ask: "How bad is it?"  (Scale 0-10; or mild, moderate, severe)    - NONE (0): no pain    - MILD (1-3): doesn't interfere with normal activities     - MODERATE (4-7): interferes with normal activities or awakens from sleep     - SEVERE (8-10): excruciating pain, unable to do any normal activities     Off of medicine 10. FEVER: "Do you have a fever?" If Yes, ask: "What is it, how was it measured, and when did it start?"       3/10 at best, 7/10 at worse 11. OTHER SYMPTOMS: "Do you have any other symptoms?" (e.g., abdomen pain, blood in urine, vomiting)        Right lower abdominal pain, denies fever.  Reports almost clear urine, no blood  Patient continues to have pain, he has had no relief so far. Appointment scheduled for 6/11. Requests refill of hydrocodone  (not on profile) reports that he has three pills left. Discussed alternation with ibuprofen. Will go to ED if pain or other symptoms worsen before scheduled appointment  Protocols used: Kidney Stone Follow-up Call-A-AH

## 2023-10-01 NOTE — Telephone Encounter (Signed)
 Patient chart shows Appt schld for June 11th with Dr. Sandy Crumb

## 2023-10-02 ENCOUNTER — Ambulatory Visit

## 2023-10-02 ENCOUNTER — Ambulatory Visit: Payer: Self-pay | Admitting: Sports Medicine

## 2023-10-02 ENCOUNTER — Ambulatory Visit (INDEPENDENT_AMBULATORY_CARE_PROVIDER_SITE_OTHER): Admitting: Sports Medicine

## 2023-10-02 VITALS — BP 119/77 | HR 67 | Wt 242.0 lb

## 2023-10-02 DIAGNOSIS — N200 Calculus of kidney: Secondary | ICD-10-CM | POA: Diagnosis not present

## 2023-10-02 DIAGNOSIS — R109 Unspecified abdominal pain: Secondary | ICD-10-CM | POA: Diagnosis not present

## 2023-10-02 DIAGNOSIS — R112 Nausea with vomiting, unspecified: Secondary | ICD-10-CM

## 2023-10-02 LAB — POCT URINALYSIS DIP (CLINITEK)
Bilirubin, UA: NEGATIVE
Blood, UA: NEGATIVE
Glucose, UA: NEGATIVE mg/dL
Ketones, POC UA: NEGATIVE mg/dL
Leukocytes, UA: NEGATIVE
Nitrite, UA: NEGATIVE
Spec Grav, UA: 1.025 (ref 1.010–1.025)
Urobilinogen, UA: 0.2 U/dL
pH, UA: 6 (ref 5.0–8.0)

## 2023-10-02 MED ORDER — ONDANSETRON 8 MG PO TBDP: 8.0000 mg | ORAL_TABLET | Freq: Three times a day (TID) | ORAL | 3 refills | Status: DC | PRN

## 2023-10-02 MED ORDER — HYDROCODONE-ACETAMINOPHEN 5-325 MG PO TABS: 1.0000 | ORAL_TABLET | Freq: Three times a day (TID) | ORAL | 0 refills | Status: DC

## 2023-10-02 NOTE — Addendum Note (Signed)
 Addended by: Gean Keels on: 10/02/2023 09:03 PM   Modules accepted: Orders

## 2023-10-02 NOTE — Addendum Note (Signed)
 Addended by: Burton Casey L on: 10/02/2023 11:10 AM   Modules accepted: Orders

## 2023-10-02 NOTE — Progress Notes (Addendum)
    Procedures performed today:    None.  Independent interpretation of notes and tests performed by another provider:   None.  Brief History, Exam, Impression, and Recommendations:    Nephrolithiasis right ureteropelvic junction Pleasant previously healthy 43 year old male, was woken up at night with severe right lower quadrant pain, ultimately seen in the ED, CT did show a 3 mm nonobstructing right ureteropelvic junction stone with only minimal ureteral dilatation proximally. He was treated aggressively with hydrocodone , Zofran , Flomax. He has not noted whether a stone passed or not however he has not been given a strainer. Pain persists, he has a positive pain to percussion right costovertebral angle, the rest of his exam is normal, we will get an updated x-ray to see if the stone is still in place, I will refill his hydrocodone , he does understand we need to be liberal with pain management, we can do hydrocodone  3 times daily, refilling Zofran . Referral to urology will depend on whether the stone is still in place.  Update: 3 mm stone still at the UPJ on the right, patient still symptomatic, I would like urology to weigh in however I do suspect treatment will still be expectant.    ____________________________________________ Joselyn Nicely. Sandy Crumb, M.D., ABFM., CAQSM., AME. Primary Care and Sports Medicine [facility]  Adjunct Professor of [facility] Medicine  [employer]  Restaurant manager, fast food

## 2023-10-02 NOTE — Assessment & Plan Note (Addendum)
 Pleasant previously healthy 43 year old male, was woken up at night with severe right lower quadrant pain, ultimately seen in the ED, CT did show a 3 mm nonobstructing right ureteropelvic junction stone with only minimal ureteral dilatation proximally. He was treated aggressively with hydrocodone , Zofran , Flomax. He has not noted whether a stone passed or not however he has not been given a strainer. Pain persists, he has a positive pain to percussion right costovertebral angle, the rest of his exam is normal, we will get an updated x-ray to see if the stone is still in place, I will refill his hydrocodone , he does understand we need to be liberal with pain management, we can do hydrocodone  3 times daily, refilling Zofran . Referral to urology will depend on whether the stone is still in place.  Update: 3 mm stone still at the UPJ on the right, patient still symptomatic, I would like urology to weigh in however I do suspect treatment will still be expectant.

## 2023-10-03 NOTE — Addendum Note (Signed)
 Addended by: Gean Keels on: 10/03/2023 03:40 PM   Modules accepted: Orders

## 2023-10-05 LAB — URINE CULTURE

## 2023-10-07 ENCOUNTER — Ambulatory Visit: Admitting: Urology

## 2023-10-18 ENCOUNTER — Telehealth: Payer: Self-pay | Admitting: Sports Medicine

## 2023-10-18 DIAGNOSIS — N200 Calculus of kidney: Secondary | ICD-10-CM

## 2023-10-18 NOTE — Telephone Encounter (Unsigned)
 Copied from CRM [id number]. Topic: Clinical - Medication Refill >> Oct 18, 2023  4:43 PM Kevelyn M wrote: Medication: HYDROcodone -acetaminophen  (NORCO/VICODIN) 5-325 MG tablet   Has the patient contacted their pharmacy? No (Agent: If no, request that the patient contact the pharmacy for the refill. If patient does not wish to contact the pharmacy document the reason why and proceed with request.) (Agent: If yes, when and what did the pharmacy advise?)  This is the patient's preferred pharmacy:  [facility] [address] [place], [place] [address] [phone]  Is this the correct pharmacy for this prescription? Yes If no, delete pharmacy and type the correct one.   Has the prescription been filled recently? Yes  Is the patient out of the medication? No  Has the patient been seen for an appointment in the last year OR does the patient have an upcoming appointment? Yes  Can we respond through MyChart? No  Agent: Please be advised that Rx refills may take up to 3 business days. We ask that you follow-up with your pharmacy.

## 2023-10-21 ENCOUNTER — Telehealth: Payer: Self-pay | Admitting: Sports Medicine

## 2023-10-21 NOTE — Telephone Encounter (Signed)
 Prescription Request  10/21/2023  LOV: 10/02/2023  What is the name of the medication or equipment? HYDROcodone -acetaminophen  (NORCO/VICODIN) 5-325 MG tablet And  ondansetron  (ZOFRAN -ODT) 8 MG disintegrating tablet   Have you contacted your pharmacy to request a refill? Yes   Which pharmacy would you like this sent to?  [facility] - [place], [place] - [address] [place] [address] Phone: [phone] Fax: [phone]    Patient notified that their request is being sent to the clinical staff for review and that they should receive a response within 2 business days.   Please advise at [facility] [phone]

## 2023-10-22 MED ORDER — HYDROCODONE-ACETAMINOPHEN 5-325 MG PO TABS: 1.0000 | ORAL_TABLET | Freq: Three times a day (TID) | ORAL | 0 refills | Status: DC

## 2023-10-28 NOTE — Telephone Encounter (Signed)
 HYDROcodone -acetaminophen  (NORCO/VICODIN) has been sent to preferred pharmacy 10/22/2023.

## 2023-11-04 ENCOUNTER — Encounter: Payer: Self-pay | Admitting: Urgent Care

## 2023-11-04 ENCOUNTER — Other Ambulatory Visit: Payer: Self-pay | Admitting: Urgent Care

## 2023-11-04 ENCOUNTER — Ambulatory Visit: Admitting: Urgent Care

## 2023-11-04 VITALS — BP 134/85 | HR 65 | Resp 18 | Ht 72.0 in | Wt 235.5 lb

## 2023-11-04 DIAGNOSIS — D72829 Elevated white blood cell count, unspecified: Secondary | ICD-10-CM | POA: Diagnosis not present

## 2023-11-04 DIAGNOSIS — N200 Calculus of kidney: Secondary | ICD-10-CM

## 2023-11-04 DIAGNOSIS — K760 Fatty (change of) liver, not elsewhere classified: Secondary | ICD-10-CM

## 2023-11-04 DIAGNOSIS — R1031 Right lower quadrant pain: Secondary | ICD-10-CM

## 2023-11-04 MED ORDER — TAMSULOSIN HCL 0.4 MG PO CAPS: 0.4000 mg | ORAL_CAPSULE | Freq: Every day | ORAL | 3 refills | Status: DC

## 2023-11-04 MED ORDER — AMOXICILLIN-POT CLAVULANATE 875-125 MG PO TABS: 1.0000 | ORAL_TABLET | Freq: Two times a day (BID) | ORAL | 0 refills | Status: AC

## 2023-11-04 NOTE — Patient Instructions (Addendum)
 Please re-start the flomax . Take one daily. INCREASE your water drastically - try to consume 3L  Please start augmentin , take twice daily with food x 7 days. If your right-sided abdominal pain continues despite the medications prescribed today, please complete the CT scan. You can call the number below to schedule:  [facility] Address: [address] [place], [place] [address] Phone: [phone]    Please schedule your routine health maintenance exam within the next few months.

## 2023-11-04 NOTE — Progress Notes (Unsigned)
 Established Patient Office Visit  Subjective:  Patient ID: Troy Wilkerson, male    DOB: August 12, 1980  Age: 43 y.o. MRN: [id number]  Chief Complaint  Patient presents with   Transitions Of Care    Kidney stones f/u pt states it has gotten better but still has intermittent pain on his right side and upper abdomen area    HPI  Discussed the use of AI scribe software for clinical note transcription with the patient, who gave verbal consent to proceed.  History of Present Illness   The patient presents for follow-up after experiencing a kidney stone.  In early June, the patient experienced a kidney stone initially suspected to be appendicitis, leading to an ER visit where a CT scan confirmed the diagnosis. He was prescribed tamsulosin  (Flomax ), Zofran , and a pain medication. This was his first kidney stone.  He followed up on June 11, receiving additional nausea and pain medication. A urine culture was normal. Symptoms persisted for approximately six weeks but have since resolved. He has an upcoming urology appointment in late August. He is unsure if the stone passed but noted symptom resolution about one to two weeks ago. He took Flomax  daily until the prescription ended and used hydrocodone  as needed for pain.  The pain was described as different from typical back pain, located in the lower back but deeper, with occasional soreness in the genital area and under the rib cage. Intermittent pain radiated from the belly button towards the side, appearing suddenly and lasting for days.  He has a history of high calcium levels and a slightly elevated white blood cell count during the ER visit. He has not experienced gout and is not on any daily prescriptions, using ibuprofen occasionally for soreness.  His beverage intake has changed from frequent [facility] consumption to primarily water, Gatorade, and some juice over the past six weeks. He prefers bottled water over tap water.  No current flank  pain, fever, or issues with urination. Occasional soreness in the genital area and under the rib cage, with intermittent pain from the belly button towards the right side. No allergies to antibiotics.       Patient Active Problem List   Diagnosis Date Noted   Nephrolithiasis right ureteropelvic junction 10/02/2023   Left hip pain 09/11/2023   Rotavirus enteritis 10/01/2017   Encounter for nonprocreative genetic counseling and testing 09/13/2017   Regurgitation of food 07/26/2017   Hypertension goal BP (blood pressure) < 130/80 07/26/2017   Lumbar spinal stenosis 07/24/2017   Encounter for medication management 06/28/2017   Habitual snoring 06/28/2017   Long uvula 06/28/2017   Excessive daytime sleepiness 06/28/2017   Weight gain due to medication 05/31/2017   Class 1 obesity due to excess calories without serious comorbidity in adult 05/31/2017   Chronic non-seasonal allergic rhinitis 04/22/2017   Nonintractable episodic headache 04/17/2017   Epigastric pain 04/11/2017   Non-intractable cyclical vomiting with nausea 02/28/2017   Dyspepsia 02/28/2017   Nausea 02/07/2017   Paronychia of finger of right hand 02/07/2017   Anxiety associated with depression 01/16/2017   Bone lesion 01/10/2017   Low libido 01/10/2017   Tennis elbow 12/27/2016   Knee pain, bilateral 12/27/2016   Depression, major, single episode, moderate (HCC) 12/27/2016   Past Medical History:  Diagnosis Date   Anxiety    Benign tumor of thoracic site    54 mos old   Depression    Obesity    Past Surgical History:  Procedure Laterality Date  KNEE ARTHROSCOPY     left 2011   THORACOTOMY     6 mos old   Social History   Tobacco Use   Smoking status: Never   Smokeless tobacco: Never  Vaping Use   Vaping status: Never Used  Substance Use Topics   Alcohol use: No   Drug use: No      ROS: as noted in HPI  Objective:     BP 134/85 (BP Location: Left Arm, Patient Position: Sitting, Cuff Size:  Large)   Pulse 65   Resp 18   Ht 6' (1.829 m)   Wt 235 lb 8 oz (106.8 kg)   SpO2 97%   BMI 31.94 kg/m  BP Readings from Last 3 Encounters:  11/04/23 134/85  10/02/23 119/77  09/11/23 119/74   Wt Readings from Last 3 Encounters:  11/04/23 235 lb 8 oz (106.8 kg)  10/02/23 242 lb (109.8 kg)  09/11/23 243 lb (110.2 kg)      Physical Exam Vitals and nursing note reviewed.  Constitutional:      General: He is not in acute distress.    Appearance: Normal appearance. He is not ill-appearing, toxic-appearing or diaphoretic.  HENT:     Head: Normocephalic and atraumatic.     Right Ear: Tympanic membrane, ear canal and external ear normal. There is no impacted cerumen.     Left Ear: Tympanic membrane, ear canal and external ear normal. There is no impacted cerumen.     Nose: Nose normal.     Mouth/Throat:     Mouth: Mucous membranes are moist.     Pharynx: Oropharynx is clear. No oropharyngeal exudate or posterior oropharyngeal erythema.  Eyes:     General: No scleral icterus.       Right eye: No discharge.        Left eye: No discharge.     Extraocular Movements: Extraocular movements intact.     Pupils: Pupils are equal, round, and reactive to light.  Neck:     Thyroid: No thyroid mass, thyromegaly or thyroid tenderness.  Cardiovascular:     Rate and Rhythm: Normal rate and regular rhythm.     Pulses: Normal pulses.     Heart sounds: No murmur heard. Pulmonary:     Effort: Pulmonary effort is normal. No respiratory distress.     Breath sounds: Normal breath sounds. No stridor. No wheezing or rhonchi.  Abdominal:     General: Abdomen is flat. Bowel sounds are normal. There is no distension.     Palpations: Abdomen is soft. There is no mass.     Tenderness: There is no abdominal tenderness. There is no right CVA tenderness, left CVA tenderness, guarding or rebound. Negative signs include Murphy's sign.      Comments: No signs of acute abdomen or peritonitis   Musculoskeletal:     Cervical back: Normal range of motion and neck supple. No rigidity or tenderness.     Right lower leg: No edema.     Left lower leg: No edema.  Lymphadenopathy:     Cervical: No cervical adenopathy.  Skin:    General: Skin is warm and dry.     Coloration: Skin is not jaundiced.     Findings: No bruising, erythema or rash.  Neurological:     General: No focal deficit present.     Mental Status: He is alert and oriented to person, place, and time.     Sensory: No sensory deficit.     Motor:  No weakness.  Psychiatric:        Mood and Affect: Mood normal.        Behavior: Behavior normal.     The ASCVD Risk score (Arnett DK, et al., 2019) failed to calculate for the following reasons:   Cannot find a previous HDL lab   Cannot find a previous total cholesterol lab  Assessment & Plan:  Nephrolithiasis right ureteropelvic junction -     CBC with Differential/Platelet -     CMP14+EGFR -     Urine Microscopic -     Uric acid -     Tamsulosin  HCl; Take 1 capsule (0.4 mg total) by mouth daily.  Dispense: 30 capsule; Refill: 3 -     CT ABDOMEN PELVIS WO CONTRAST; Future  Calcium blood increased -     CMP14+EGFR  Leukocytosis, unspecified type -     CBC with Differential/Platelet  Hepatic steatosis -     CMP14+EGFR  Right lower quadrant abdominal pain -     Amoxicillin -Pot Clavulanate; Take 1 tablet by mouth 2 (two) times daily with a meal for 7 days.  Dispense: 14 tablet; Refill: 0 -     CT ABDOMEN PELVIS WO CONTRAST; Future  Assessment and Plan    Kidney Stone 3mm kidney stone causing symptoms for six + weeks. CT showed perinephritic stranding and elevated WBC, indicating possible infection. Symptoms resolved, but residual infection is a concern. Hesitant about urology follow-up due to symptom resolution and insurance issues. - Prescribed Augmentin  twice daily with food for seven days. - Ordered urine microscopic analysis to identify crystals. -  Recheck CBC and CMP to monitor calcium and WBC. - Provided Flomax  to relax ureter and facilitate stone passage. - Advised increased water intake to three liters daily. - Discussed aggressive workup today to potentially avoid urology referral.  Hepatic Steatosis Incidental finding on CT scan, unrelated to current symptoms.  Umbilical Hernia Incidental tiny fat-containing umbilical hernia on CT scan. Occasional pain reported, no bulging.  General Health Maintenance Transitioned from excessive [facility] to primarily water and some Gatorade, beneficial for kidney health. - Encouraged continued hydration with filtered or bottled water. - Advised against excessive sodas, iced tea, and caffeinated beverages.  Follow-up Uncertain about necessity of urology appointment due to symptom resolution. Decision left to his discretion. - Advised to monitor symptoms and consider urology follow-up if symptoms persist or recur. - Repeat CT scan if symptoms persist after antibiotics.         Return in about 4 months (around 03/06/2024) for Annual Physical.   Benton LITTIE Gave, PA

## 2023-11-05 ENCOUNTER — Ambulatory Visit: Payer: Self-pay | Admitting: Urgent Care

## 2023-11-05 LAB — CBC WITH DIFFERENTIAL/PLATELET
Basophils Absolute: 0.1 x10E3/uL (ref 0.0–0.2)
Basos: 1 %
EOS (ABSOLUTE): 0.3 x10E3/uL (ref 0.0–0.4)
Eos: 3 %
Hematocrit: 40.3 % (ref 37.5–51.0)
Hemoglobin: 13.5 g/dL (ref 13.0–17.7)
Immature Grans (Abs): 0 x10E3/uL (ref 0.0–0.1)
Immature Granulocytes: 0 %
Lymphocytes Absolute: 2.5 x10E3/uL (ref 0.7–3.1)
Lymphs: 24 %
MCH: 30.5 pg (ref 26.6–33.0)
MCHC: 33.5 g/dL (ref 31.5–35.7)
MCV: 91 fL (ref 79–97)
Monocytes Absolute: 0.9 x10E3/uL (ref 0.1–0.9)
Monocytes: 8 %
Neutrophils Absolute: 6.7 x10E3/uL (ref 1.4–7.0)
Neutrophils: 64 %
Platelets: 316 x10E3/uL (ref 150–450)
RBC: 4.43 x10E6/uL (ref 4.14–5.80)
RDW: 12.2 % (ref 11.6–15.4)
WBC: 10.4 x10E3/uL (ref 3.4–10.8)

## 2023-11-05 LAB — CMP14+EGFR
ALT: 20 IU/L (ref 0–44)
AST: 14 IU/L (ref 0–40)
Albumin: 4.4 g/dL (ref 4.1–5.1)
Alkaline Phosphatase: 87 IU/L (ref 44–121)
BUN/Creatinine Ratio: 11 (ref 9–20)
BUN: 20 mg/dL (ref 6–24)
Bilirubin Total: 0.4 mg/dL (ref 0.0–1.2)
CO2: 23 mmol/L (ref 20–29)
Calcium: 9.9 mg/dL (ref 8.7–10.2)
Chloride: 105 mmol/L (ref 96–106)
Creatinine, Ser: 1.84 mg/dL — ABNORMAL HIGH (ref 0.76–1.27)
Globulin, Total: 2.4 g/dL (ref 1.5–4.5)
Glucose: 80 mg/dL (ref 70–99)
Potassium: 4.5 mmol/L (ref 3.5–5.2)
Sodium: 143 mmol/L (ref 134–144)
Total Protein: 6.8 g/dL (ref 6.0–8.5)
eGFR: 46 mL/min/1.73 — ABNORMAL LOW (ref >59)

## 2023-11-05 LAB — URINALYSIS, MICROSCOPIC ONLY
Bacteria, UA: NONE SEEN
Casts: NONE SEEN /LPF
Epithelial Cells (non renal): NONE SEEN /HPF (ref 0–10)
RBC, Urine: NONE SEEN /HPF (ref 0–2)
WBC, UA: NONE SEEN /HPF (ref 0–5)

## 2023-11-05 LAB — URIC ACID: Uric Acid: 5.8 mg/dL (ref 3.8–8.4)

## 2023-12-24 ENCOUNTER — Encounter: Payer: Self-pay | Admitting: Sports Medicine

## 2024-03-06 ENCOUNTER — Encounter: Payer: Self-pay | Admitting: Urgent Care

## 2024-03-06 ENCOUNTER — Ambulatory Visit: Admitting: Urgent Care

## 2024-03-06 VITALS — BP 119/76 | HR 61 | Ht 72.0 in | Wt 251.0 lb

## 2024-03-06 DIAGNOSIS — R635 Abnormal weight gain: Secondary | ICD-10-CM | POA: Diagnosis not present

## 2024-03-06 DIAGNOSIS — M545 Low back pain, unspecified: Secondary | ICD-10-CM

## 2024-03-06 DIAGNOSIS — Z0000 Encounter for general adult medical examination without abnormal findings: Secondary | ICD-10-CM

## 2024-03-06 DIAGNOSIS — R351 Nocturia: Secondary | ICD-10-CM | POA: Diagnosis not present

## 2024-03-06 DIAGNOSIS — Z87442 Personal history of urinary calculi: Secondary | ICD-10-CM | POA: Diagnosis not present

## 2024-03-06 DIAGNOSIS — Z6834 Body mass index (BMI) 34.0-34.9, adult: Secondary | ICD-10-CM

## 2024-03-06 LAB — POCT URINALYSIS DIP (CLINITEK)
Bilirubin, UA: NEGATIVE
Blood, UA: NEGATIVE
Glucose, UA: NEGATIVE mg/dL
Ketones, POC UA: NEGATIVE mg/dL
Leukocytes, UA: NEGATIVE
Nitrite, UA: NEGATIVE
POC PROTEIN,UA: NEGATIVE
Spec Grav, UA: 1.02 (ref 1.010–1.025)
Urobilinogen, UA: 0.2 U/dL
pH, UA: 5.5 (ref 5.0–8.0)

## 2024-03-06 NOTE — Patient Instructions (Addendum)
 We completed your annual physical today. Continue to abstain from sodas and increase water intake.  Try to incorporate more raw veggies and physical activity in your daily routine.   For your back you can try the OTC lidocaine patch. If it worsens, we can do injections.  Purchase a happy light to help with seasonal affective disorder and see if this helps with your mood.  Return annually, sooner as needed.

## 2024-03-06 NOTE — Progress Notes (Signed)
 Annual Wellness Visit     Patient: Troy Wilkerson, Male    DOB: [birth year], 43 y.o.   MRN: [id number]  Subjective  Chief Complaint  Patient presents with   Annual Exam    fasting    Troy Wilkerson is a 43 y.o. male who presents today for his Annual Wellness Visit. He reports consuming a general diet. The patient does not participate in regular exercise at present. He generally feels fairly well. He reports sleeping fairly well. He does have additional problems to discuss today.   HPI  Vision:Not within last year  and Dental: No current dental problems and Receives regular dental care  Discussed the use of AI scribe software for clinical note transcription with the patient, who gave verbal consent to proceed.  History of Present Illness   Troy Wilkerson is a 43 year old male who presents for an annual physical exam.  He has a history of urinary issues, which have resolved and not recurred. Previously, he experienced kidney stones, leading to changes in his beverage consumption, such as stopping carbonated drinks and switching to water, lemonade, and sweet tea. He notes weight gain since the last visit, attributing it to substituting sweets for soda.  His diet is excessive and lacking in fruits and vegetables. He occasionally takes a multivitamin and consumes dairy products inconsistently due to occasional stomach upset. Long work hours limit his physical activity, and he finds it challenging to incorporate exercise into his routine due to his schedule and family commitments.  He reports back pain over the last two to three weeks, located in the middle of his back. He has a history of foramenal stenosis, as seen on a prior MRI. Currently, he uses ibuprofen as needed for pain relief.  He experiences sleep disturbances, waking up two times a night to urinate since his kidney stone episode. No difficulty with urine stream initiation or flow. He denies any family history of prostate issues.  He  wants more energy and notes a recent lack of interest in activities, which he attributes to the time change. He has experienced mornings where he did not want to get out of bed, which is unusual for him.  He is not currently on any prescription medications but uses ibuprofen for back pain.       Patient Active Problem List   Diagnosis Date Noted   Nocturia 03/06/2024   Lumbar back pain 03/06/2024   History of kidney stones 03/06/2024   Nephrolithiasis right ureteropelvic junction 10/02/2023   Left hip pain 09/11/2023   Rotavirus enteritis 10/01/2017   Encounter for nonprocreative genetic counseling and testing 09/13/2017   Regurgitation of food 07/26/2017   Hypertension goal BP (blood pressure) < 130/80 07/26/2017   Lumbar spinal stenosis 07/24/2017   Encounter for medication management 06/28/2017   Habitual snoring 06/28/2017   Long uvula 06/28/2017   Excessive daytime sleepiness 06/28/2017   Weight gain due to medication 05/31/2017   Class 1 obesity due to excess calories without serious comorbidity in adult 05/31/2017   Chronic non-seasonal allergic rhinitis 04/22/2017   Nonintractable episodic headache 04/17/2017   Epigastric pain 04/11/2017   Non-intractable cyclical vomiting with nausea 02/28/2017   Dyspepsia 02/28/2017   Nausea 02/07/2017   Paronychia of finger of right hand 02/07/2017   Anxiety associated with depression 01/16/2017   Bone lesion 01/10/2017   Low libido 01/10/2017   Tennis elbow 12/27/2016   Knee pain, bilateral 12/27/2016   Depression, major, single episode, moderate (HCC) 12/27/2016  Past Medical History:  Diagnosis Date   Anxiety    Benign tumor of thoracic site    64 mos old   Depression    Obesity    Past Surgical History:  Procedure Laterality Date   KNEE ARTHROSCOPY     left 2011   THORACOTOMY     6 mos old   Social History   Tobacco Use   Smoking status: Never   Smokeless tobacco: Never  Vaping Use   Vaping status: Never Used   Substance Use Topics   Alcohol use: No   Drug use: No      Medications: Outpatient Medications Prior to Visit  Medication Sig   [DISCONTINUED] tamsulosin  (FLOMAX ) 0.4 MG CAPS capsule Take 1 capsule (0.4 mg total) by mouth daily. (Patient not taking: Reported on 03/06/2024)   No facility-administered medications prior to visit.    Allergies  Allergen Reactions   Cymbalta  [Duloxetine  Hcl] Nausea And Vomiting    GeneSight: significant gene-drug interaction   Luvox  [Fluvoxamine ] Other (See Comments)    GeneSight: significant gene-drug interaction    Patient Care Team: Lowella Benton CROME, [place] as PCP - General (Physician Assistant)  ROS Complete 12 point ROS performed with all pertinent positives listed in HPI     Objective  BP 119/76   Pulse 61   Ht 6' (1.829 m)   Wt 251 lb (113.9 kg)   SpO2 97%   BMI 34.04 kg/m  BP Readings from Last 3 Encounters:  03/06/24 119/76  11/04/23 134/85  10/02/23 119/77   Wt Readings from Last 3 Encounters:  03/06/24 251 lb (113.9 kg)  11/04/23 235 lb 8 oz (106.8 kg)  10/02/23 242 lb (109.8 kg)      Physical Exam Vitals and nursing note reviewed. Exam conducted with a chaperone present.  Constitutional:      General: He is not in acute distress.    Appearance: Normal appearance. He is not ill-appearing, toxic-appearing or diaphoretic.  HENT:     Head: Normocephalic and atraumatic.     Right Ear: Tympanic membrane, ear canal and external ear normal. There is no impacted cerumen.     Left Ear: Tympanic membrane, ear canal and external ear normal. There is no impacted cerumen.     Nose: Nose normal.     Mouth/Throat:     Mouth: Mucous membranes are moist.     Pharynx: Oropharynx is clear. No oropharyngeal exudate or posterior oropharyngeal erythema.  Eyes:     General: No scleral icterus.       Right eye: No discharge.        Left eye: No discharge.     Extraocular Movements: Extraocular movements intact.     Pupils: Pupils are  equal, round, and reactive to light.  Neck:     Thyroid: No thyroid mass, thyromegaly or thyroid tenderness.  Cardiovascular:     Rate and Rhythm: Normal rate and regular rhythm.     Pulses: Normal pulses.     Heart sounds: No murmur heard. Pulmonary:     Effort: Pulmonary effort is normal. No respiratory distress.     Breath sounds: Normal breath sounds. No stridor. No wheezing or rhonchi.  Abdominal:     General: Abdomen is flat. Bowel sounds are normal. There is no distension.     Palpations: Abdomen is soft. There is no mass.     Tenderness: There is no abdominal tenderness. There is no guarding.  Musculoskeletal:     Cervical back: Normal  range of motion and neck supple. No rigidity or tenderness.     Right lower leg: No edema.     Left lower leg: No edema.  Lymphadenopathy:     Cervical: No cervical adenopathy.  Skin:    General: Skin is warm and dry.     Coloration: Skin is not jaundiced.     Findings: No bruising, erythema or rash.  Neurological:     General: No focal deficit present.     Mental Status: He is alert and oriented to person, place, and time.     Sensory: No sensory deficit.     Motor: No weakness.  Psychiatric:        Mood and Affect: Mood normal.        Behavior: Behavior normal.     Most recent depression screenings:    03/06/2024    8:44 AM 09/27/2017    9:24 AM  PHQ 2/9 Scores  PHQ - 2 Score 1 4  PHQ- 9 Score 3 16      Data saved with a previous flowsheet row definition    Fall Screening Falls in the past year?: 0 Number of falls in past year: 0  Fall Risk Fall risk Follow up: Falls evaluation completed    Vision/Hearing Screen: No results found.  Last CBC Lab Results  Component Value Date   WBC 10.4 11/04/2023   HGB 13.5 11/04/2023   HCT 40.3 11/04/2023   MCV 91 11/04/2023   MCH 30.5 11/04/2023   RDW 12.2 11/04/2023   PLT 316 11/04/2023   Last metabolic panel Lab Results  Component Value Date   GLUCOSE 80 11/04/2023    NA 143 11/04/2023   K 4.5 11/04/2023   CL 105 11/04/2023   CO2 23 11/04/2023   BUN 20 11/04/2023   CREATININE 1.84 (H) 11/04/2023   EGFR 46 (L) 11/04/2023   CALCIUM 9.9 11/04/2023   PROT 6.8 11/04/2023   ALBUMIN 4.4 11/04/2023   LABGLOB 2.4 11/04/2023   BILITOT 0.4 11/04/2023   ALKPHOS 87 11/04/2023   AST 14 11/04/2023   ALT 20 11/04/2023   Last lipids No results found for: CHOL, HDL, LDLCALC, LDLDIRECT, TRIG, CHOLHDL Last hemoglobin A1c No results found for: HGBA1C Last thyroid functions Lab Results  Component Value Date   TSH 2.16 12/27/2016   Last vitamin D No results found for: 25OHVITD2, 25OHVITD3, VD25OH Last vitamin B12 and Folate No results found for: VITAMINB12, FOLATE    Results for orders placed or performed in visit on 03/06/24  POCT URINALYSIS DIP (CLINITEK)  Result Value Ref Range   Color, UA yellow yellow   Clarity, UA clear clear   Glucose, UA negative negative mg/dL   Bilirubin, UA negative negative   Ketones, POC UA negative negative mg/dL   Spec Grav, UA 8.979 8.989 - 1.025   Blood, UA negative negative   pH, UA 5.5 5.0 - 8.0   POC PROTEIN,UA negative negative, trace   Urobilinogen, UA 0.2 0.2 or 1.0 E.U./dL   Nitrite, UA Negative Negative   Leukocytes, UA Negative Negative         03/06/2024    8:44 AM 09/27/2017    9:24 AM 09/13/2017    8:22 AM 07/26/2017    8:38 AM 06/28/2017    8:27 AM  Depression screen PHQ 2/9  Decreased Interest 1 2 2 2 3   Down, Depressed, Hopeless 0 2 2 2 2   PHQ - 2 Score 1 4 4 4 5   Altered sleeping  1 2 2 2 2   Tired, decreased energy 0 2 2 2 3   Change in appetite 1 3 1 1 2   Feeling bad or failure about yourself  0 2 1 1 2   Trouble concentrating 0 1 2 1 1   Moving slowly or fidgety/restless 0 1 2 0 1  Suicidal thoughts 0 1 1 0 1  PHQ-9 Score 3 16  15  11  17    Difficult doing work/chores Somewhat difficult  Somewhat difficult Somewhat difficult      Data saved with a previous flowsheet row  definition      03/06/2024    8:45 AM 09/27/2017    9:25 AM 09/13/2017    8:22 AM 07/26/2017    8:39 AM  GAD 7 : Generalized Anxiety Score  Nervous, Anxious, on Edge 1 2 2 2   Control/stop worrying 0 3 3 2   Worry too much - different things 1 3 2 2   Trouble relaxing 1 3 3 2   Restless 0 2 2 1   Easily annoyed or irritable 0 2 2 1   Afraid - awful might happen 0 1 1 1   Total GAD 7 Score 3 16 15 11   Anxiety Difficulty Not difficult at all  Somewhat difficult Somewhat difficult    Assessment & Plan   Annual wellness visit done today including the all of the following: Reviewed patient's Family and Medical History Reviewed and updated list of patient's medical providers Assessment of cognitive impairment was done Assessed patient's functional ability Established a written schedule for health screening services Health Risk Assessent Completed and Reviewed  Exercise Activities and Dietary recommendations  Goals   Increase physical activity - 15 min per day; decrease weight     Immunization History  Administered Date(s) Administered   Tdap 05/04/2022    Health Maintenance  Topic Date Due   COVID-19 Vaccine (1) 03/22/2024 (Originally 08/13/1985)   Influenza Vaccine  07/21/2024 (Originally 11/22/2023)   Hepatitis B Vaccines 19-59 Average Risk (1 of 3 - 19+ 3-dose series) 03/06/2025 (Originally 08/14/1999)   Hepatitis C Screening  03/06/2025 (Originally 08/14/1998)   DTaP/Tdap/Td (2 - Td or Tdap) 05/04/2032   Pneumococcal Vaccine  Aged Out   Meningococcal B Vaccine  Aged Out   HPV VACCINES  Discontinued     Discussed health benefits of physical activity, and encouraged him to engage in regular exercise appropriate for his age and condition.    Problem List Items Addressed This Visit     Nocturia   Relevant Orders   PSA   POCT URINALYSIS DIP (CLINITEK) (Completed)   Lumbar back pain   History of kidney stones   Relevant Orders   POCT URINALYSIS DIP (CLINITEK) (Completed)    Other Visit Diagnoses       Routine adult health maintenance    -  Primary   Relevant Orders   CBC with Differential/Platelet   Hemoglobin A1c   TSH   Lipid panel   Comprehensive metabolic panel with GFR   PSA   POCT URINALYSIS DIP (CLINITEK) (Completed)     Weight gain       Relevant Orders   Hemoglobin A1c   TSH   Lipid panel   Comprehensive metabolic panel with GFR     BMI 34.0-34.9,adult          Assessment and Plan    Adult Wellness Visit Routine wellness visit. Blood pressure controlled. Discussed seasonal affective disorder risk due to time change and sunlight exposure. - Ordered A1c test for  diabetes screening. - Ordered PSA test for prostate screening. - Discussed seasonal affective light for potential seasonal affective disorder. - Encouraged incorporation of vegetables and fruits into diet. - Encouraged regular physical activity.  Abnormal weight gain and obesity (BMI 34.0-34.9) Weight gain possibly due to substitution of carbonated drinks with sweets. Discussed caloric deficit and water intake importance, especially with kidney stone history. - Encouraged reduction of sweet intake. - Encouraged increased water consumption.  Nocturia Increased nocturnal urination possibly related to increased water intake. No family history of prostate issues. Discussed PSA testing as routine screening. - Ordered PSA test for prostate screening.  Low back pain with degenerative disc disease and lumbar foraminal stenosis Chronic low back pain managed with ibuprofen. MRI showed foraminal stenosis. Discussed potential foraminal injections if pain worsens. - Continue ibuprofen for pain management. - Consider lidocaine patches for topical pain relief. - Will consider referral for foraminal injections if pain worsens.  Personal history of urinary calculi History of kidney stones. Advised to avoid carbonated drinks. Currently consuming water, lemonade, and sweet tea. -  Encouraged continued avoidance of carbonated drinks. - Encouraged increased water intake.        Return in about 1 year (around 03/06/2025) for Annual Physical.     Benton LITTIE Gave, PA

## 2024-03-07 LAB — COMPREHENSIVE METABOLIC PANEL WITH GFR
ALT: 27 IU/L (ref 0–44)
AST: 16 IU/L (ref 0–40)
Albumin: 4.3 g/dL (ref 4.1–5.1)
Alkaline Phosphatase: 70 IU/L (ref 47–123)
BUN/Creatinine Ratio: 11 (ref 9–20)
BUN: 16 mg/dL (ref 6–24)
Bilirubin Total: 0.8 mg/dL (ref 0.0–1.2)
CO2: 23 mmol/L (ref 20–29)
Calcium: 9.6 mg/dL (ref 8.7–10.2)
Chloride: 105 mmol/L (ref 96–106)
Creatinine, Ser: 1.4 mg/dL — ABNORMAL HIGH (ref 0.76–1.27)
Globulin, Total: 2.5 g/dL (ref 1.5–4.5)
Glucose: 91 mg/dL (ref 70–99)
Potassium: 4.1 mmol/L (ref 3.5–5.2)
Sodium: 144 mmol/L (ref 134–144)
Total Protein: 6.8 g/dL (ref 6.0–8.5)
eGFR: 64 mL/min/1.73 (ref >59)

## 2024-03-07 LAB — CBC WITH DIFFERENTIAL/PLATELET
Basophils Absolute: 0.1 x10E3/uL (ref 0.0–0.2)
Basos: 1 %
EOS (ABSOLUTE): 0.2 x10E3/uL (ref 0.0–0.4)
Eos: 2 %
Hematocrit: 46.7 % (ref 37.5–51.0)
Hemoglobin: 15.5 g/dL (ref 13.0–17.7)
Immature Grans (Abs): 0 x10E3/uL (ref 0.0–0.1)
Immature Granulocytes: 0 %
Lymphocytes Absolute: 2.6 x10E3/uL (ref 0.7–3.1)
Lymphs: 26 %
MCH: 30 pg (ref 26.6–33.0)
MCHC: 33.2 g/dL (ref 31.5–35.7)
MCV: 90 fL (ref 79–97)
Monocytes Absolute: 0.7 x10E3/uL (ref 0.1–0.9)
Monocytes: 7 %
Neutrophils Absolute: 6.5 x10E3/uL (ref 1.4–7.0)
Neutrophils: 64 %
Platelets: 302 x10E3/uL (ref 150–450)
RBC: 5.17 x10E6/uL (ref 4.14–5.80)
RDW: 12.4 % (ref 11.6–15.4)
WBC: 10 x10E3/uL (ref 3.4–10.8)

## 2024-03-07 LAB — LIPID PANEL
Chol/HDL Ratio: 5.6 ratio — ABNORMAL HIGH (ref 0.0–5.0)
Cholesterol, Total: 225 mg/dL — ABNORMAL HIGH (ref 100–199)
HDL: 40 mg/dL (ref >39)
LDL Chol Calc (NIH): 160 mg/dL — ABNORMAL HIGH (ref 0–99)
Triglycerides: 137 mg/dL (ref 0–149)
VLDL Cholesterol Cal: 25 mg/dL (ref 5–40)

## 2024-03-07 LAB — HEMOGLOBIN A1C
Est. average glucose Bld gHb Est-mCnc: 108 mg/dL
Hgb A1c MFr Bld: 5.4 % (ref 4.8–5.6)

## 2024-03-07 LAB — PSA: Prostate Specific Ag, Serum: 1.1 ng/mL (ref 0.0–4.0)

## 2024-03-07 LAB — TSH: TSH: 1.7 u[IU]/mL (ref 0.450–4.500)

## 2024-03-08 ENCOUNTER — Ambulatory Visit: Payer: Self-pay | Admitting: Urgent Care

## 2025-03-12 ENCOUNTER — Encounter: Admitting: Urgent Care
